# Patient Record
Sex: Male | Born: 1955 | Race: White | Hispanic: No | Marital: Married | Smoking: Never smoker
Health system: Southern US, Community
[De-identification: ages and names within clinical notes are randomized; demographics above are authoritative.]

## PROBLEM LIST (undated history)

## (undated) DIAGNOSIS — I499 Cardiac arrhythmia, unspecified: Secondary | ICD-10-CM

## (undated) DIAGNOSIS — I471 Supraventricular tachycardia, unspecified: Secondary | ICD-10-CM

## (undated) HISTORY — DX: Cardiac arrhythmia, unspecified: I49.9

## (undated) HISTORY — PX: KNEE SURGERY: SHX244

---

## 2005-10-11 ENCOUNTER — Emergency Department: Payer: Self-pay | Admitting: Emergency Medicine

## 2007-08-25 ENCOUNTER — Observation Stay (HOSPITAL_COMMUNITY): Admission: EM | Admit: 2007-08-25 | Discharge: 2007-08-26 | Payer: Self-pay | Admitting: Cardiology

## 2007-08-25 ENCOUNTER — Ambulatory Visit: Payer: Self-pay | Admitting: Internal Medicine

## 2007-08-25 ENCOUNTER — Encounter: Payer: Self-pay | Admitting: Cardiology

## 2007-12-02 ENCOUNTER — Ambulatory Visit: Payer: Self-pay | Admitting: Internal Medicine

## 2008-08-08 DIAGNOSIS — I471 Supraventricular tachycardia, unspecified: Secondary | ICD-10-CM | POA: Insufficient documentation

## 2010-03-09 ENCOUNTER — Emergency Department (HOSPITAL_COMMUNITY)
Admission: EM | Admit: 2010-03-09 | Discharge: 2010-03-09 | Payer: Self-pay | Source: Home / Self Care | Admitting: Emergency Medicine

## 2010-03-12 ENCOUNTER — Encounter: Payer: Self-pay | Admitting: Emergency Medicine

## 2010-03-12 LAB — CBC
MCH: 31.5 pg (ref 26.0–34.0)
MCV: 86.1 fL (ref 78.0–100.0)
Platelets: 211 10*3/uL (ref 150–400)
RBC: 5.04 MIL/uL (ref 4.22–5.81)
RDW: 12.5 % (ref 11.5–15.5)
WBC: 5.7 10*3/uL (ref 4.0–10.5)

## 2010-03-12 LAB — DIFFERENTIAL
Basophils Relative: 0 % (ref 0–1)
Eosinophils Absolute: 0.2 10*3/uL (ref 0.0–0.7)
Eosinophils Relative: 3 % (ref 0–5)
Lymphocytes Relative: 46 % (ref 12–46)
Lymphs Abs: 2.6 10*3/uL (ref 0.7–4.0)
Neutro Abs: 2.5 10*3/uL (ref 1.7–7.7)

## 2010-03-12 LAB — BASIC METABOLIC PANEL
CO2: 26 mEq/L (ref 19–32)
Calcium: 9.6 mg/dL (ref 8.4–10.5)
GFR calc Af Amer: >60 mL/min (ref >60)
Sodium: 137 mEq/L (ref 135–145)

## 2010-03-12 LAB — POCT CARDIAC MARKERS
CKMB, poc: 2.4 ng/mL (ref 1.0–8.0)
Troponin i, poc: <0.05 ng/mL (ref 0.00–0.09)

## 2010-03-12 LAB — D-DIMER, QUANTITATIVE: D-Dimer, Quant: 0.26 ug/mL-FEU (ref 0.00–0.48)

## 2010-07-03 NOTE — Cardiovascular Report (Signed)
NAME:  [name] [name], [name] [name]                  ACCOUNT NO.:  [id number]   MEDICAL RECORD NO.:  [id number]          PATIENT TYPE:  INP   LOCATION:  4733                         FACILITY:  [facility]   PHYSICIAN:  Rollene Rotunda, MD, FACCDATE OF BIRTH:  [birth year]   DATE OF PROCEDURE:  DATE OF DISCHARGE:                            CARDIAC CATHETERIZATION   PRIMARY CARE PHYSICIAN:  None.   PROCEDURE:  Left heart catheterization/arteriography.   INDICATIONS:  The patient has non-Q-wave myocardial infarction in the  phase of supraventricular tachycardia.   PROCEDURE NOTE:  Left heart catheterization performed via the right  femoral artery.  The artery was cannulated using anterior Blankley puncture.  A #6-French arterial sheath was inserted via the modified Seldinger  technique.  Preformed Judkins and a pigtail catheter were utilized.  The  patient tolerated the procedure well, and he left the lab in stable  condition.   RESULTS:  Hemodynamics:  LV 133/11, AO 130/82.   Coronaries:  Left main was normal.  The LAD was normal.  The first  diagonal and second diagonal were moderate size and normal.  The third  diagonal was small and normal.  The circumflex had ostial 25% stenosis  in the AV groove.  The AV groove had diffuse luminal irregularities.  There was a mid obtuse marginal which was very large and normal.   Right coronary artery:  Right coronary artery is dominant vessel.  It  was normal.  PDA was moderate size and normal.   Left ventriculogram:  A left ventriculogram was obtained in the RAO  projection.  The EF was 65% with normal Knickerbocker motion.   CONCLUSION:  Mild coronary artery plaque.  Normal left ventricular  function.   PLAN:  The patient will have further evaluation by electrophysiology for  management of his supraventricular cardia.      Rollene Rotunda, MD, [facility]  Electronically Signed     JH/MEDQ  D:  08/25/2007  T:  08/26/2007  Job:  [id number]

## 2010-07-03 NOTE — Assessment & Plan Note (Signed)
[facility]                         ELECTROPHYSIOLOGY OFFICE NOTE   NAME:Wallander, DOC MANDALA                         MRN:          [id number]  DATE:12/02/2007                            DOB:          03/19/1955    Mr. Plass returns today for followup.  He is a very pleasant middle-aged  man with a history of SVT several weeks ago resulting in admission to  the hospital where he ruled in for MI, underwent catheterization  demonstrating no obstructive coronary artery disease.  This was narrow  QRS tachycardia.  It was treated with adenosine with termination.  When  I saw him in the hospital several months ago, I offered him catheter  ablation simply because he had ruled in for MI with his episode of SVT  along with a severe chest pain.  The patient at that time want to  consider his options and at that time we recommended that he stop using  tobacco and also stop using caffeine.  He subsequently done this and  returns today for followup.  He denies chest pain or shortness of  breath.  He has had no recurrent symptomatic SVT.  He is on no  medications.   PHYSICAL EXAMINATION:  GENERAL:  On exam, he is a pleasant, well-  appearing, middle-aged man in no acute distress.  VITAL SIGNS:  Blood pressure was 122/72, the pulse 70 and regular,  respirations were 18, and weight was 255 pounds.  NECK:  No jugular distention.  LUNGS:  Clear bilaterally to auscultation.  No wheezes, rales, or  rhonchi are present.  CARDIAC:  Regular rate and rhythm.  Normal S1and S2.  No murmurs, rubs,  or gallops present.  ABDOMINAL:  Soft and nontender.  EXTREMITIES:  Demonstrate no edema.   IMPRESSION:  Supraventricular tachycardia with a single isolated  episode.   DISCUSSION:  I have discussed treatment options with the patient and his  wife who is with him today.  While catheter ablation was considered an  option, my recommendation and plan would be undergo a period of  watchful  waiting.  He has been asymptomatic since his one and only episode of  SVT, and for this reason, I have recommended period of watchful waiting.  Obviously, if he is becomes quite anxious about recurrent SVT or  actually experiences recurrent SVT, then catheter ablation would  certainly be recommended.  I will see the patient back in 6 months.     Doylene Canning. Ladona Ridgel, MD  Electronically Signed    GWT/MedQ  DD: 12/02/2007  DT: 12/03/2007  Job #: [id number]

## 2010-07-03 NOTE — H&P (Signed)
NAME:  [name] [name], [name] [name]                  ACCOUNT NO.:  [id number]   MEDICAL RECORD NO.:  [id number]          PATIENT TYPE:  INP   LOCATION:  2919                         FACILITY:  [facility]   PHYSICIAN:  Wendi Snipes, MD DATE OF BIRTH:  [birth year]   DATE OF ADMISSION:  08/25/2007  DATE OF DISCHARGE:                              HISTORY & PHYSICAL   CHIEF COMPLAINT:  Palpitations, shortness of breath.   HISTORY OF PRESENT ILLNESS:  This is a 55 year old healthy white male  with no past medical history who presented to [facility] shortly after  midnight after noticing a rapid pulse that occurred at approximately  10:30 at night.  This had never happened before.  He noted that he was  in his usual state of health in the evening and laid down and felt fine;  however, he felt restless and then all of a sudden noticed that his  pulse began to race.  He had some shortness of breath associated with  these symptoms; however, he had no chest pain, syncope, presyncope,  increased lower extremity edema, paroxysmal nocturnal dyspnea or  orthopnea with these symptoms.  He presented to [facility] where he was  found to be in a regular SVT which was treated with adenosine and  subsequently converted to normal sinus rhythm.  His troponin was  elevated to 0.68 with some lateral ST depressions on his tachycardiac  EKG.  He was subsequently transferred to [facility] for further  evaluation and management of the SVT.   PAST MEDICAL HISTORY:  None.   MEDICATIONS:  None.   SOCIAL HISTORY:  He lives in [place].  He is employed.  He is married.  He does not smoke.   FAMILY HISTORY:  His dad died of an unknown illness at 35.  Otherwise  hypertension runs in his family.  No premature coronary disease that he  knows of.   REVIEW OF SYSTEMS:  All 14 systems were reviewed and were negative  except as mentioned in the HPI.   PHYSICAL EXAMINATION:  VITAL SIGNS:  His blood pressure is 127/77, his  pulse is  57.  He is sating 99% on room air.  He is breathing 16 times a  minute.  GENERAL:  He is a 55 year old white male appearing his stated age in no  acute distress.  HEENT:  Moist mucous membranes.  Pupils equal, round, reactive to light  and accommodation.  Anicteric sclerae.  NECK:  No jugular venous distention.  No thyromegaly.  CARDIAC:  Regular rate and rhythm.  No murmurs, rubs or gallops.  PULMONARY:  Clear to auscultation bilaterally.  ABDOMEN:  Nontender, nondistended.  Positive bowel sounds.  No masses.  EXTREMITIES:  No clubbing, cyanosis or edema.  Pulses 3+ throughout.  SKIN:  Warm, dry and intact.  No rashes.  NEUROLOGIC:  Cranial nerves II-XII grossly intact.  Alert and oriented  x3.  No focal neurologic deficits.  PSYCHIATRIC:  Mood and affect are appropriate.   LABORATORY AND RADIOLOGY REVIEW:  His troponin went from 0.17 to 0.68  and then subsequently to  0.81.  His CK-MB was mildly elevated at 11.8.  His white blood cell count is not elevated.  His hematocrit is 40.  His  creatinine is 1.04.   ELECTROCARDIOGRAM:  Tachycardic EKG shows a regular narrow complex  tachycardia at 171 beats per minute with possible retrograde inverted T-  waves in inferior leads which suggests AVNRT.   ASSESSMENT/PLAN:  1. 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  We will place      him on a long-acting calcium channel blocker at this point until      further plans are made for possible ablation of this rhythm.  He is      currently asymptomatic, hemodynamically stable.  His EKG shows no      evidence of ischemia.  His cardiac biomarkers are likely elevated      to a perfusion demand mismatch secondary to extreme tachycardia.      There are ST depressions laterally; however, this can be normal in      this type of arrhythmia.  He may benefit from a noninvasive risk      stratification as an outpatient after this acute event.  Will check      an echocardiogram for structural  heart disease in the morning;      however, by exam, his left ventricular function is intact.      Wendi Snipes, MD  Electronically Signed     BHH/MEDQ  D:  08/25/2007  T:  08/25/2007  Job:  [id number]

## 2010-07-03 NOTE — Discharge Summary (Signed)
NAME:  Gelber, Rajan                  ACCOUNT NO.:  [id number]   MEDICAL RECORD NO.:  [id number]          PATIENT TYPE:  INP   LOCATION:  4733                         FACILITY:  [facility]   PHYSICIAN:  Doylene Canning. Ladona Ridgel, MD    DATE OF BIRTH:  23-Oct-1955   DATE OF ADMISSION:  08/25/2007  DATE OF DISCHARGE:  08/26/2007                               DISCHARGE SUMMARY   ALLERGIES:  This patient has no known drug allergies.   TIME FOR DICTATION AND EXAM:  Greater than 40 minutes.   He has no primary caregiver.   FINAL DIAGNOSES:  1. Admitted to [facility] with supraventricular tachycardia, which      broke with adenosine probably a reentry pattern (this is his only      episode at age 55).  2. Electrocardiogram shows lateral leads with ST depression.      a.     Elevated troponin I studies with a zenith of 2.07, this was       NQWMI (type II).  3. Left heart catheterization, August 25, 2007, nonobstructive coronary      artery disease, ejection fraction 69%.  4. Resting bradycardia unable to modulate the atrioventricular node      with medication going forward.  5. A 2-D echocardiogram ejection fraction of 55%-65%, no left      ventricular Troiani motion abnormalities, and mild basal septal      hypertrophy.   SECONDARY DIAGNOSIS:  Hypertension.   PLAN:  To see Dr. Ladona Ridgel, Wednesday, December 02, 2007, at 3:30 p.m.   PROCEDURE:  1. A 2-D echocardiogram on August 25, 2007, ejection fraction 55%-65%, no      left ventricular Lutter motion abnormalities.  2. Left heart catheterization on August 25, 2007, study showed that the      left main was free of disease.  The LAD also free of disease.  The      left circumflex had an ostial 25% stenosis in the AV groove,      diffuse luminal irregularities.  Right coronary artery is dominant      without stenosis.  The PDA, a moderate size and normal ejection      fraction at catheterization of 69%.   BRIEF HISTORY:  Mr. Celestine is 55 year old male.  He has no past  medical  history.  He presented to [facility] shortly after midnight after  noticing a rapid pulse, which had been preceding for about 40 minutes  prior to his admission.   The patient was in his usual state of health.  That evening, he had been  slightly deprived of sleep having had only 5-1/2 hours the evening  before.  He also started noticed that his pulse began to race.  He had  some shortness of breath.  He had no chest pain, no syncope, no  presyncope, no dyspnea, and no orthopnea.   He presented to [facility].  He was found to be in SVT.  This was  treated with adenosine and converted to sinus rhythm.  The patient's  cardiac enzymes were elevated and  he had some lateral ST depressions.  He was referred to [facility] for further evaluation.   HOSPITAL COURSE:  The patient presents from the emergency room at [facility].  He had an episode of his first one of SVT probably a reentry  pattern.  This was aborted with adenosine therapy.  Irregularities on  the electrocardiogram were noted.  He was transferred to [facility] where he underwent left heart catheterization on August 25, 2007.  The study as dictated above showed nonobstructive coronary artery  disease with preserved ejection fraction.  An electrophysiology consult  was obtained due to the patient having SVT with 170 beats per minute  speed.  The risks and benefits of ablation therapy have been discussed  with the patient.  He wishes to take a breather to discharge home and to  consider ablation in the future.  He was advised to avoid caffeine in  coffee or in soda.  He is advised to drink Gatorade if he is active and  perspires heavily.  He had a resting bradycardia making the use of AV  node modulators unrealistic.  The patient discharged on enteric-coated  aspirin 81 mg daily.  He will follow up with Dr. Ladona Ridgel in the  [facility], Wednesday, December 02, 2007, at  3:30.   LAB  STUDIES THIS ADMISSION:  On the day of discharge, hemoglobin 14.6,  hematocrit 40.9, white cells 8.2, and platelets 215.  Serum electrolytes  on day of discharge, sodium 140, potassium 3.9, chloride 107,  bicarbonate 27, BUN is 8, creatinine is 0.87, and glucose is 91.  Protime this admission 13.9, INR 1.1, alkaline phosphatase 33, SGOT 34,  SGPT is 30, and troponin I studies were in serial fashion 0.81 and is  2.07.  TSH this admission is 2.214.  The BNP this admission was 52.      Maple Mirza, PA      Doylene Canning. Ladona Ridgel, MD  Electronically Signed    GM/MEDQ  D:  08/26/2007  T:  08/27/2007  Job:  [id number]

## 2010-11-15 LAB — COMPREHENSIVE METABOLIC PANEL
AST: 34
Albumin: 3.6
Chloride: 106
Creatinine, Ser: 0.9
GFR calc Af Amer: >60
Potassium: 3.7
Total Bilirubin: 0.8
Total Protein: 6

## 2010-11-15 LAB — DIFFERENTIAL
Lymphocytes Relative: 24
Lymphs Abs: 1.5
Monocytes Absolute: 0.3
Monocytes Relative: 5
Neutro Abs: 4.2

## 2010-11-15 LAB — CBC
HCT: 40.9
Hemoglobin: 14.3
MCV: 90.4
Platelets: 215
Platelets: 229
RBC: 4.54
RDW: 12.6
RDW: 12.8
WBC: 6.2
WBC: 7.1
WBC: 8.2

## 2010-11-15 LAB — POCT CARDIAC MARKERS
CKMB, poc: 7.3
Myoglobin, poc: 254
Troponin i, poc: 0.68

## 2010-11-15 LAB — HEPARIN ANTI-XA: Heparin LMW: <0.1

## 2010-11-15 LAB — CARDIAC PANEL(CRET KIN+CKTOT+MB+TROPI)
Relative Index: 3.4 — ABNORMAL HIGH
Total CK: 480 — ABNORMAL HIGH
Troponin I: 2.07

## 2010-11-15 LAB — BASIC METABOLIC PANEL
BUN: 8
CO2: 26
Calcium: 9
Calcium: 9.5
Creatinine, Ser: 0.87
GFR calc Af Amer: >60
GFR calc non Af Amer: >60
GFR calc non Af Amer: >60
Glucose, Bld: 91
Potassium: 3.9
Sodium: 140

## 2010-11-15 LAB — PROTIME-INR
INR: 1.1
Prothrombin Time: 13.9

## 2010-11-15 LAB — TSH: TSH: 2.214 (ref 0.350–4.500)

## 2010-11-15 LAB — CK TOTAL AND CKMB (NOT AT ARMC)
CK, MB: 11.8 — ABNORMAL HIGH
Total CK: 596 — ABNORMAL HIGH

## 2010-11-15 LAB — B-NATRIURETIC PEPTIDE (CONVERTED LAB): Pro B Natriuretic peptide (BNP): 52

## 2011-03-28 ENCOUNTER — Encounter: Payer: Self-pay | Admitting: Internal Medicine

## 2011-03-28 ENCOUNTER — Ambulatory Visit (INDEPENDENT_AMBULATORY_CARE_PROVIDER_SITE_OTHER): Payer: PRIVATE HEALTH INSURANCE | Admitting: Internal Medicine

## 2011-03-28 DIAGNOSIS — I471 Supraventricular tachycardia: Secondary | ICD-10-CM

## 2011-03-28 MED ORDER — METOPROLOL SUCCINATE ER 50 MG PO TB24: 50.0000 mg | ORAL_TABLET | Freq: Every day | ORAL | Status: DC

## 2011-03-28 NOTE — Patient Instructions (Signed)
Your physician has recommended you make the following change in your medication: start taking Metoprolol 50 mg as needed.  Your physician recommends that you schedule a follow-up appointment in: as needed.

## 2011-03-31 NOTE — Assessment & Plan Note (Signed)
I have discussed the treatment options with the patient. I have recommended he use beta blockers to take on an as needed basis. If his symptoms increase in frequency then catheter ablation would be a consideration.

## 2011-03-31 NOTE — Progress Notes (Signed)
HPI Mr. Noseworthy returns today after a several year absence from our EP clinic. He has a h/o SVT which has been well controlled. He experienced an episode several weeks ago where his heart raced for nearly an hour before valsalva maneuvers resulted in its termination. It was associated with SOB. No syncope or chest pain.  No Known Allergies   Current Outpatient Prescriptions  Medication Sig Dispense Refill  . aspirin 325 MG tablet Take 325 mg by mouth daily.      . metoprolol succinate (TOPROL XL) 50 MG 24 hr tablet Take 1 tablet (50 mg total) by mouth daily. Take with or immediately following a meal.  30 tablet  1     Past Medical History  Diagnosis Date  . Arrhythmia     SVT    ROS:   All systems reviewed and negative except as noted in the HPI.   Past Surgical History  Procedure Date  . Knee surgery      No family history on file.   History   Social History  . Marital Status: Married    Spouse Name: N/A    Number of Children: N/A  . Years of Education: N/A   Occupational History  . Not on file.   Social History Main Topics  . Smoking status: Never Smoker   . Smokeless tobacco: Not on file  . Alcohol Use: Yes  . Drug Use: No  . Sexually Active: Not on file   Other Topics Concern  . Not on file   Social History Narrative  . No narrative on file     BP 146/91  Pulse 58  Resp 18  Ht 6\' 3"  (1.905 m)  Wt 119.75 kg (264 lb)  BMI 33.00 kg/m2  Physical Exam:  Well appearing middle aged man, NAD HEENT: Unremarkable Neck:  No JVD, no thyromegally Lungs:  Clear with no wheezes. HEART:  Regular rate rhythm, no murmurs, no rubs, no clicks Abd:  soft, positive bowel sounds, no organomegally, no rebound, no guarding Ext:  2 plus pulses, no edema, no cyanosis, no clubbing Skin:  No rashes no nodules Neuro:  CN II through XII intact, motor grossly intact  EKG Sinus bradycardia  Assess/Plan:

## 2012-04-01 ENCOUNTER — Inpatient Hospital Stay: Payer: Self-pay | Admitting: Specialist

## 2012-04-01 LAB — CBC
HGB: 15.6 g/dL (ref 13.0–18.0)
MCV: 90 fL (ref 80–100)
WBC: 10.1 10*3/uL (ref 3.8–10.6)

## 2012-04-01 LAB — BASIC METABOLIC PANEL
Anion Gap: 6 — ABNORMAL LOW (ref 7–16)
Calcium, Total: 9.1 mg/dL (ref 8.5–10.1)
Chloride: 104 mmol/L (ref 98–107)
Co2: 27 mmol/L (ref 21–32)
Osmolality: 277 (ref 275–301)

## 2012-04-01 LAB — TROPONIN I: Troponin-I: 7.73 ng/mL — ABNORMAL HIGH

## 2012-04-01 LAB — PROTIME-INR: INR: 0.9

## 2012-04-02 LAB — TROPONIN I: Troponin-I: 5.66 ng/mL — ABNORMAL HIGH

## 2012-04-02 LAB — CK TOTAL AND CKMB (NOT AT ARMC): CK, Total: 288 U/L — ABNORMAL HIGH (ref 35–232)

## 2012-04-03 DIAGNOSIS — I214 Non-ST elevation (NSTEMI) myocardial infarction: Secondary | ICD-10-CM

## 2012-04-03 LAB — BASIC METABOLIC PANEL
Anion Gap: 9 (ref 7–16)
BUN: 13 mg/dL (ref 7–18)
Chloride: 109 mmol/L — ABNORMAL HIGH (ref 98–107)
EGFR (African American): >60
EGFR (Non-African Amer.): >60
Glucose: 95 mg/dL (ref 65–99)

## 2012-04-14 ENCOUNTER — Encounter: Payer: Self-pay | Admitting: Cardiovascular Disease

## 2013-01-28 ENCOUNTER — Ambulatory Visit: Payer: Self-pay | Admitting: Gastroenterology

## 2014-06-10 NOTE — Consult Note (Signed)
General Aspect 59 year old male with a hx of SVT, who presents to the hospital with palpitations, diaphoresis and shortness of breath.Cardiology was consulted for arrhythmia, NSTEMI (troponin elevation to 7).   history of intermittent supraventricular tachycardia, takes metoprolol on an as needed basis. Has seen Dr. Cristopher Peru from [facility] cardiology who had recommended a possible ablation if he continues to have  SVT epsiodes. No follow up in over a year and a half. The patient says he has not really had any symptoms of palpitations and has only used his metoprolol maybe once in the past 3 years.   Yesterday at work, he started developing significant palpitations and then became diaphoretic with shortness of breath. He tried Valsalva  to see if it would break SVT, and after about an hour, the symptoms eventually broke. He did not have his metoprolol with him.   He came to the ER for further evaluation. Initial EKG at triage showed some ST changes in the lateral leads, consistent with ST depressions. The patient never really had any chest pain.   Present Illness .  PAST MEDICAL HISTORY: Is consistent with SVT.    ALLERGIES: No known drug allergies.   SOCIAL HISTORY: No smoking. No alcohol abuse. No illicit drug abuse. Lives at home by himself.   FAMILY HISTORY:  father died when he was 65 years of age. His mother is alive, she has ovarian cancer.   CURRENT MEDICATIONS: Metoprolol 50 mg as needed, aspirin 81 mg daily.   Physical Exam:  GEN well developed, well nourished, no acute distress   HEENT red conjunctivae   NECK supple  No masses   RESP clear BS   CARD Regular rate and rhythm  No murmur   ABD denies tenderness  soft   LYMPH negative axillae   EXTR negative edema   SKIN normal to palpation   NEURO motor/sensory function intact   PSYCH alert, A+O to time, place, person, good insight   Review of Systems:  Subjective/Chief Complaint tachycardia, palpitations,  resolved   General: No Complaints   Skin: No Complaints   ENT: No Complaints   Eyes: No Complaints   Neck: No Complaints   Respiratory: No Complaints   Cardiovascular: Palpitations   Gastrointestinal: No Complaints   Genitourinary: No Complaints   Vascular: No Complaints   Musculoskeletal: No Complaints   Neurologic: No Complaints   Hematologic: No Complaints   Endocrine: No Complaints   Psychiatric: No Complaints   Review of Systems: All other systems were reviewed and found to be negative   Medications/Allergies Reviewed Medications/Allergies reviewed     SVT - Supraventricular Tachycardia:   Lab Results:  Routine Chem:  12-Feb-14 16:37   Creatinine (comp)  1.68    21:04   Result Comment troponin - RESULTS VERIFIED BY REPEAT TESTING.  - mpg c/ margaret james @ 2152 04/01/12  - READ-BACK PROCESS PERFORMED.  Result(s) reported on 01 Apr 2012 at 09:51PM.  13-Feb-14 04:10   Result Comment troponin - RESULTS VERIFIED BY REPEAT TESTING.  - prev called 2/12/14at 2152by mpg.nbb  Result(s) reported on 02 Apr 2012 at 05:56AM.  14-Feb-14 04:06   Glucose, Serum 95  BUN 13  Creatinine (comp) 0.94  Sodium, Serum 141  Potassium, Serum 3.8  Chloride, Serum  109  CO2, Serum 23  Calcium (Total), Serum 8.7  Anion Gap 9  Osmolality (calc) 281  eGFR (African American) >60  eGFR (Non-African American) >60 (eGFR values <31m/min/1.73 m2 may be an indication of chronic  kidney disease (CKD). Calculated eGFR is useful in patients with stable renal function. The eGFR calculation will not be reliable in acutely ill patients when serum creatinine is changing rapidly. It is not useful in  patients on dialysis. The eGFR calculation may not be applicable to patients at the low and high extremes of body sizes, pregnant women, and vegetarians.)  Cardiac:  12-Feb-14 16:37   Troponin I < 0.02 (0.00-0.05 0.05 ng/mL or less: NEGATIVE  Repeat testing in 3-6 hrs  if clinically  indicated. >0.05 ng/mL: POTENTIAL  MYOCARDIAL INJURY. Repeat  testing in 3-6 hrs if  clinically indicated. NOTE: An increase or decrease  of 30% or more on serial  testing suggests a  clinically important change)    21:04   Troponin I  7.73 (0.00-0.05 0.05 ng/mL or less: NEGATIVE  Repeat testing in 3-6 hrs  if clinically indicated. >0.05 ng/mL: POTENTIAL  MYOCARDIAL INJURY. Repeat  testing in 3-6 hrs if  clinically indicated. NOTE: An increase or decrease  of 30% or more on serial  testing suggests a  clinically important change)  CK, Total  293  CPK-MB, Serum  26.6 (Result(s) reported on 01 Apr 2012 at 09:45PM.)  13-Feb-14 04:10   Troponin I  5.66 (0.00-0.05 0.05 ng/mL or less: NEGATIVE  Repeat testing in 3-6 hrs  if clinically indicated. >0.05 ng/mL: POTENTIAL  MYOCARDIAL INJURY. Repeat  testing in 3-6 hrs if  clinically indicated. NOTE: An increase or decrease  of 30% or more on serial  testing suggests a  clinically important change)  CK, Total  288  CPK-MB, Serum  25.9 (Result(s) reported on 02 Apr 2012 at 05:49AM.)   Radiology Results: [place]:    13-Feb-14 10:18, US Kidney Bilateral  US Kidney Bilateral   REASON FOR EXAM:    Acute Renal Failure.  COMMENTS:       PROCEDURE: [place]  - US KIDNEY  - Apr 02 2012 10:18AM     RESULT: Prevoid images show bladder volume of 235.9 cc. Ureteral jets are   demonstrated bilaterally. The prostate measures 3.97 x 4.07 x 5.10 cm for   a volume of 43.1 cc. The lateral aspect of the right kidney shows a   hypoechoic area measuring 1.34 x 1.38 x 1.28 cm. The right kidney itself   measures 13.71 x 5.43 x 4.99 cm. The left kidney measures 12.27 x 5.47 x   5.75 cm. Neither kidney shows a solid mass or evidence of stones. No   obstructive changes are demonstrated. No post void bladder images are   submitted.    IMPRESSION:   1. Right renal cyst.    Dictation Site: 2        Verified By: Sundra Aland, M.D., MD    No Known  Allergies:    Impression 59 yo male w/ hx of SVT came into hospital w/ palpitaitons, shortness of breath, diaphoresis.  Was in SVT but this resolved prior to him coming to the hospital.Episode lasted several hours.   Also noted to have an abnormal ECG on arrival with nonspecific ST ABN.  1. SVT -  Second epsiode in 5 years. This epsiode lasted hours.  He will follow up with Dr. Donita Brooks EP. He is interested in SVT ablation. - cont.  Metoprolol PRN and diltiazem 30 mg PRN for epsiodes (need script for diltiazem prn) Office closed today. I will arrange outpt follow up    2. Abnormal ECG/NSTEMI -  Troponin peak was 7, then 5 ST  depressions in lateral leads at triage.   cont. ASA, B-blocker, Lovenox, Statin.  cath this AM as renal function improved.   3. Acute Renal Failure - - Renal US benign Improved, suspect dehydration.   Electronic Signatures: Ida Rogue (MD)  (Signed 14-Feb-14 08:51)  Authored: General Aspect/Present Illness, History and Physical Exam, Review of System, Past Medical History, Home Medications, Labs, Radiology, Allergies, Impression/Plan   Last Updated: 14-Feb-14 08:51 by Ida Rogue (MD)

## 2014-06-10 NOTE — Discharge Summary (Signed)
PATIENT NAME:  [name] [name] MR#:  [id number] DATE OF BIRTH:  01-24-1956  DATE OF ADMISSION:  04/01/2012 DATE OF DISCHARGE:  04/03/2012  For a detailed note, please see the history and physical done on admission.   DIAGNOSES AT DISCHARGE IS AS FOLLOWS: 1. Supraventricular tachycardia, now resolved.  2. Elevated troponin, likely in the setting of supraventricular tachycardia, a non-ST-elevation myocardial infarction ruled out. 3. Acute renal failure, now resolved.   DIET: The patient is being discharged on a regular diet.   ACTIVITY: As tolerated.   FU INSTRUCTIONS: Followup with Dr. Julien Nordmannimothy Gollan  in the next 1 to 2 weeks, also follow up with Dr. Sharrell KuGreg Taylor from Electrophysiology at [facility] also in the next few weeks.   DISCHARGE MEDICATIONS: Aspirin 81 mg daily, Cardizem 30 mg t.i.d. as needed for palpitations and SVT and Lopressor 50 mg twice daily as needed for palpitations.   CONSULTANTS DURING THE HOSPITAL COURSE: Dr. Julien Nordmannimothy Gollan from Cardiology.   PERTINENT STUDIES DONE DURING THE HOSPITAL COURSE ARE AS FOLLOWS: An ultrasound of the kidneys showing a right renal cyst. A cardiac catheterization done on February 14th showing no evidence of any significant coronary artery disease.   BRIEF HOSPITAL COURSE: This is a 59 year old male who presented to the hospital secondary to shortness of breath, palpitations and noted to be in SVT with an abnormal EKG.   PROBLEM LIST: 1. SVT: This was likely the cause of the patient's diaphoresis, shortness of breath and palpitations. The patient apparently had broken into a normal sinus rhythm prior to coming into the hospital. He was observed overnight on telemetry, did not have any further episodes of supraventricular tachycardia. He follows up with Dr. Sharrell KuGreg Taylor. He has been possibly referred for ablation. He will continue followup with Dr. Ladona Ridgelaylor and continue his p.r.n. Lopressor and Cardizem for SVT as stated. He tried vagal maneuvers and other  care prior to coming into the hospital, which seemed to have broken his SVT into a sinus rhythm.  2. Abnormal EKG with elevated troponin. The patient has some significant ST depressions in the lateral leads an the initial EKG in the ER, and there was some concern that this was related to cardiac ischemia. His second troponins did turn out to be positive at 7. He was started on Lovenox, although he remained chest pain-free and hemodynamically stable. A Cardiology consult was obtained. The patient was seen by Dr. Mariah MillingGollan, who performed a cardiac catheterization on 04/03/2012 which showed no evidence of any significant coronary artery disease. Therefore it was thought that the troponin elevation and abnormal EKG was related to the prolonged SVT and demand ischemia, and a non-ST-elevation MI was ruled out.  3. Acute renal failure. The patient presented to the hospital with a creatinine of 1.68. He was hydrated with IV fluids. His creatinine since then has improved and now come back to baseline.   CODE STATUS: THE PATIENT IS A FULL CODE.  TIME SPENT ON DISCHARGE: 40 minutes.    ____________________________ Rolly PancakeVivek J. Cherlynn KaiserSainani, MD vjs:jm D: 04/03/2012 16:16:54 ET T: 04/04/2012 12:14:33 ET JOB#: [id number]  cc: Rolly PancakeVivek J. Cherlynn KaiserSainani, MD, <Dictator> Antonieta Ibaimothy J. Gollan, MD Doylene CanningGregg W. Ladona Ridgelaylor, MD [place] SirenVIVEK J Naveed Humphres MD ELECTRONICALLY SIGNED 04/05/2012 20:54

## 2014-06-10 NOTE — H&P (Signed)
PATIENT NAME:  [name] [name], [name] [name] MR#:  [id number] DATE OF BIRTH:  05/06/1955  DATE OF ADMISSION:  04/01/2012  PRIMARY CARE PHYSICIAN: Does not have one.  CARDIOLOGIST:  Doylene Canning. Ladona Ridgel, MD  CHIEF COMPLAINT: Palpitations.   HISTORY OF PRESENT ILLNESS: This is a 59 year old male who presents to the hospital due to palpitations, diaphoresis and shortness of breath. The patient says that he has a history of intermittent supraventricular tachycardia, takes metoprolol on an as needed basis. He follows up with Dr. Lewayne Bunting from [facility] cardiology who had recommended a possible ablation if he continues to have persistent SVTs, although he has not followed up in over a year and a half. The patient says he has not really had any symptoms of palpitations and has only used his metoprolol maybe once in the past 3 years. Today at work, he started developing significant palpitations and then became a bit diaphoretic and had some shortness of breath. He tried doing some vagal maneuvers like Valsalva and also trying to dip head in the snow to see if it would break SVT, although it did not initially work, after about an hour of him trying, the symptoms did eventually break. He did not have his metoprolol with him. He came to the ER for further evaluation. Initial EKG at triage showed some ST changes in the lateral leads, consistent with ST depressions. The patient never really had any chest pain. Hospitalist services were contacted for further treatment and evaluation.   REVIEW OF SYSTEMS:   CONSTITUTIONAL: No documented fever. No weight gain, no weight loss.  EYES: No blurred or double vision.  ENT: No tinnitus. No postnasal drip. No redness of the oropharynx.  RESPIRATORY: No cough, no wheeze. No hemoptysis. Positive dyspnea.  CARDIOVASCULAR: No chest pain, no orthopnea. Positive palpitations. No syncope.  GASTROINTESTINAL: No nausea, no vomiting, diarrhea, no abdominal pain, no melena or hematochezia.   GENITOURINARY: No dysuria or hematuria.  ENDOCRINE: No polyuria or nocturia. No heat or cold intolerance.  HEMATOLOGIC: No anemia, no bruising, no bleeding.  INTEGUMENTARY: No rashes. No lesions.   MUSCULOSKELETAL: No arthritis. No swelling. No gout.  NEUROLOGIC: No numbness or tingling. No ataxia. No seizure-type activity.  PSYCHIATRIC: No anxiety. No insomnia. No ADD.   PAST MEDICAL HISTORY: Is consistent with SVT.    ALLERGIES: No known drug allergies.   SOCIAL HISTORY: No smoking. No alcohol abuse. No illicit drug abuse. Lives at home by himself.   FAMILY HISTORY: The patient's father died when he was 28 years of age. His mother is alive, she has ovarian cancer.   CURRENT MEDICATIONS: Metoprolol 50 mg as needed, aspirin 81 mg daily.   PHYSICAL EXAMINATION ON ADMISSION: Is as follows:   VITAL SIGNS:  Are noted to be: Temperature, he is afebrile, pulse 71, respirations 20, blood pressure 127/69, sats 100% on room air.  GENERAL: He is a pleasant appearing male in no apparent distress.  HEENT: Atraumatic, normocephalic. Extraocular muscles are intact. Pupils are equal and reactive to light. Sclerae is anicteric. No conjunctival injection. No pharyngeal erythema.  NECK: Supple. There is no jugular venous distention, no bruits, lymphadenopathy or thyromegaly.  HEART: Regular rate and rhythm. No murmurs, rubs or clicks.  LUNGS: Clear to auscultation bilaterally. No rales, no rhonchi, no wheezes.  ABDOMEN: Soft, flat, nontender, nondistended. Has good bowel sounds. No hepatosplenomegaly appreciated.  EXTREMITIES: No evidence of any cyanosis, clubbing or peripheral edema. He has +2 pedal and radial pulses bilaterally.  NEUROLOGIC: The patient is alert,  awake, and oriented x 3 with no focal, motor or sensory deficits appreciated bilaterally.  SKIN: Moist and warm with no rash appreciated.  LYMPHATIC: There is no cervical or axillary lymphadenopathy.   LABORATORY, DIAGNOSTIC AND RADIOLOGIC  DATA:  Showed a serum glucose of 117, BUN 19, creatinine 1.6, sodium 137, potassium 3.9, chloride 104, bicarbonate 27. Troponin less than 0.02. White cell count 10, hemoglobin 15.6, hematocrit 45.1, platelet count 278.   EKG initially showed normal sinus rhythm with left axis deviation and ST changes in the lateral leads with ST depressions. Repeat EKG shows normal sinus rhythm with no ST changes.   ASSESSMENT AND PLAN: This is a 59 year old male with a history of supraventricular tachycardia presents to the hospital with palpitations, shortness of breath and diaphoresis was likely in supraventricular tachycardia, but had broken into a normal sinus rhythm prior to coming to the hospital. He was also initially noted to have an abnormal EKG with ST changes in the lateral leads.  1.  Supraventricular tachycardia. This is likely the cause of patient's symptoms given his history of it. This is likely what led to his palpitations, diaphoresis and shortness of breath. He is clinically asymptomatic now, as he is in a normal sinus rhythm. I will observe him overnight on telemetry, follow serial cardiac markers. I will place him on low-dose beta blocker to keep him out of any further arrhythmias. The patient has seen Dr. Lewayne BuntingGregg Taylor in the past, who recommended ablation, which he likely might benefit from now and instead of being on an as needed, metoprolol, he may need to be on scheduled metoprolol for now. I will get a cardiology consult. I discussed the case with Dr. Johney FrameAllred.  2.  Abnormal EKG. The patient noted to have ST depressions in the lateral leads at triage. Questionable if this is related to cardiac ischemia, although the patient never really had any true chest pain or anginal symptoms. His repeat EKGs now shows no ST changes. I discussed the case with Dr. Johney FrameAllred over the phone and as per him, it is very common to have ST changes if the patient has been on prolonged supraventricular tachycardia. Therefore, I  will hold off on giving any other anticoagulation like heparin and Lovenox for now. We will observe him on telemetry, follow serial cardiac markers, get a Myoview in the morning.  3.  Acute renal failure. The exact etiology is unclear. We will go ahead and hydrate him with IV fluids. Follow BUN and creatinine and urine output, renal dose medications and avoid nephrotoxins. I will repeat his creatinine in the morning. Also get a renal ultrasound. If his creatinine still remains elevated,  he may need outpatient nephrology follow up.   CODE STATUS: The patient is a full code.   TIME SPENT: 50 minutes.    ____________________________ Rolly PancakeVivek J. Cherlynn KaiserSainani, MD vjs:cc D: 04/01/2012 19:53:35 ET T: 04/01/2012 20:40:13 ET JOB#: [id number]  cc: Rolly PancakeVivek J. Cherlynn KaiserSainani, MD, <Dictator> [place] SirenVIVEK J Aaliyah Cancro MD ELECTRONICALLY SIGNED 04/05/2012 20:54

## 2014-09-13 IMAGING — US US RENAL KIDNEY
1 series · 14 of 25 positions shown · non-contrast
Comparison: none

REASON FOR EXAM: Acute Renal Failure.
COMMENTS:

[Series 1: us renal kidney · 0.28mm/px · 14 of 30 slices shown]
[im 1/30]
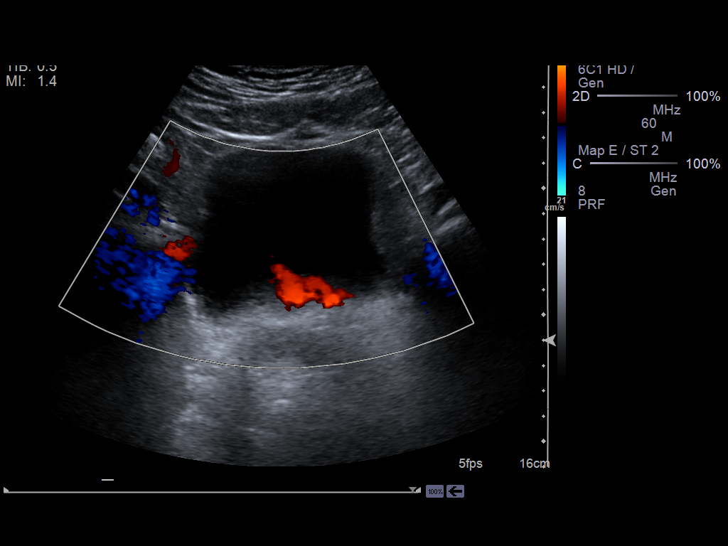
[im 3/30]
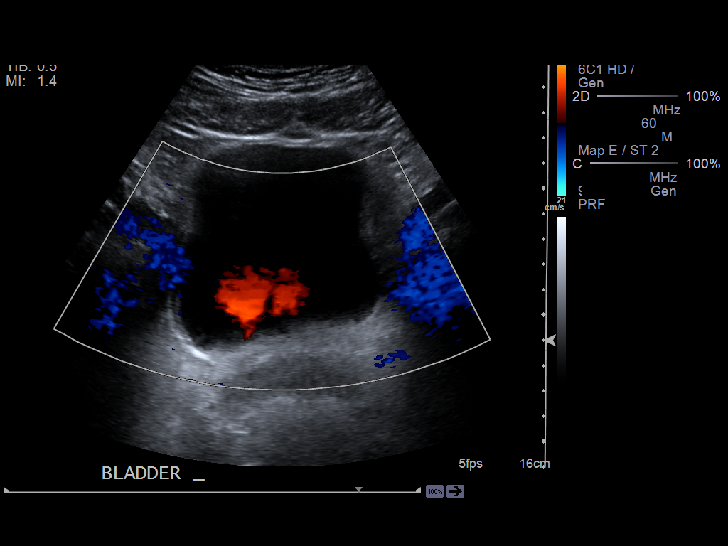
[im 5/30]
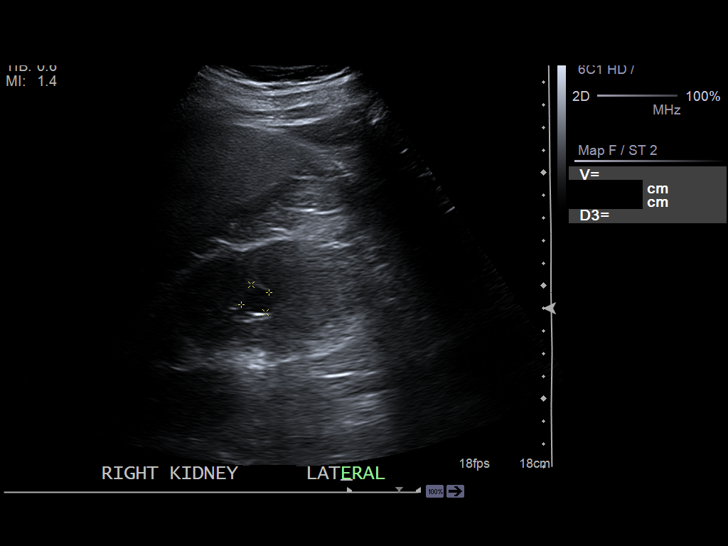
[im 8/30]
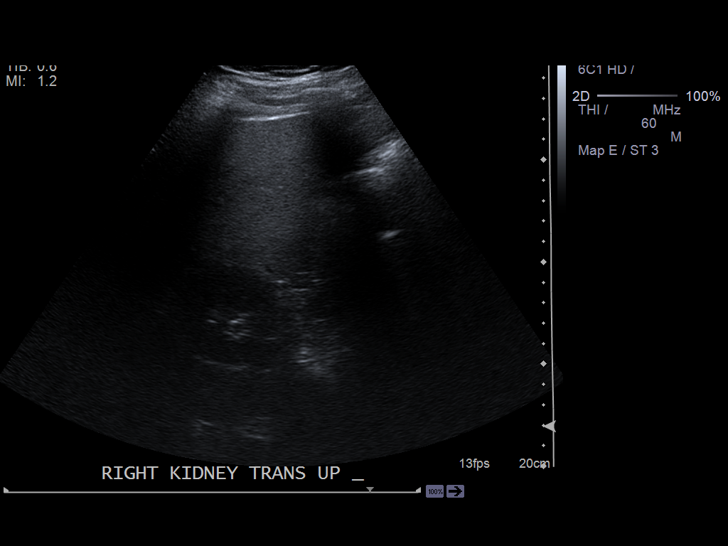
[im 10/30]
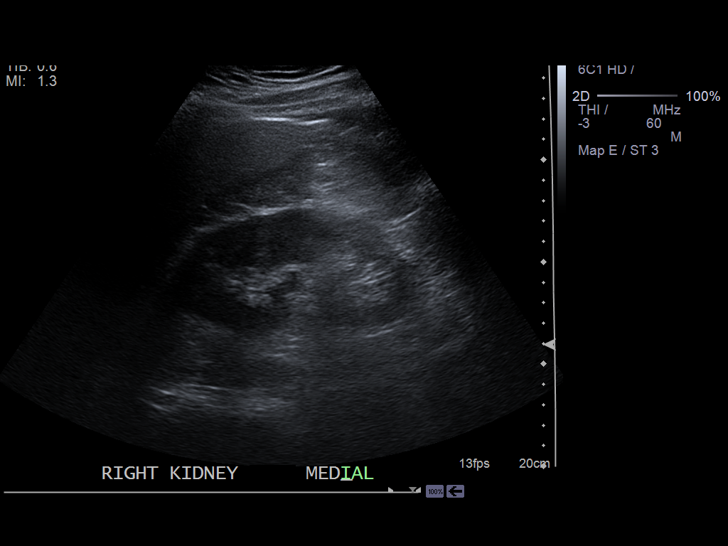
[im 11/30]
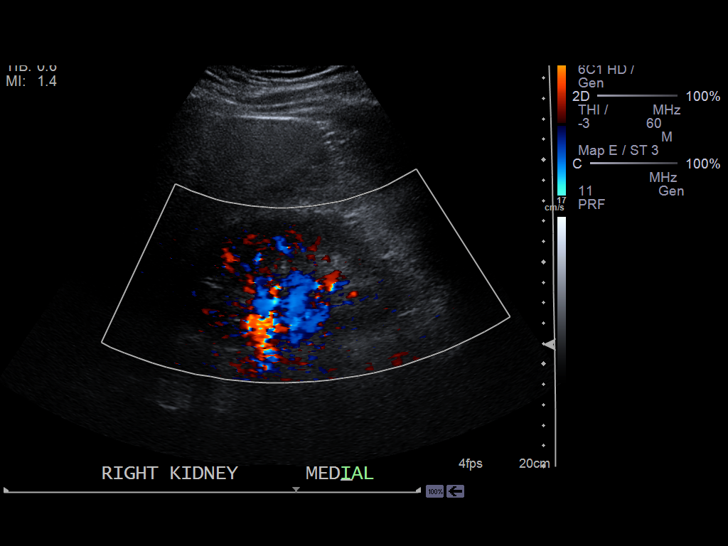
[im 14/30]
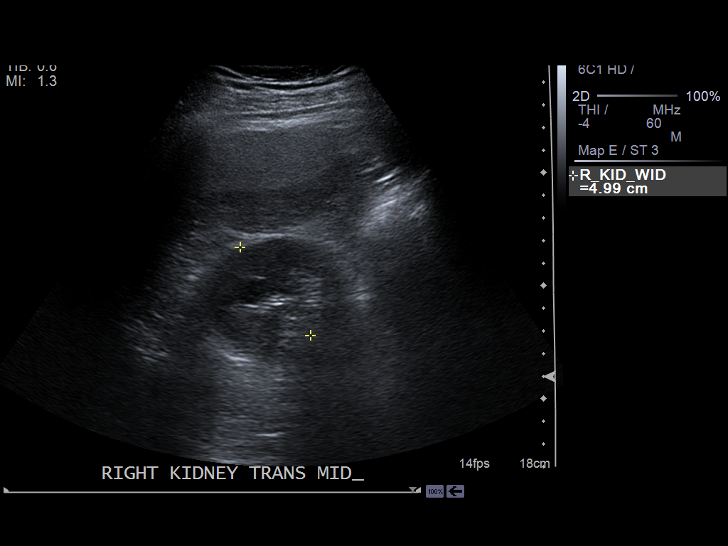
[im 16/30]
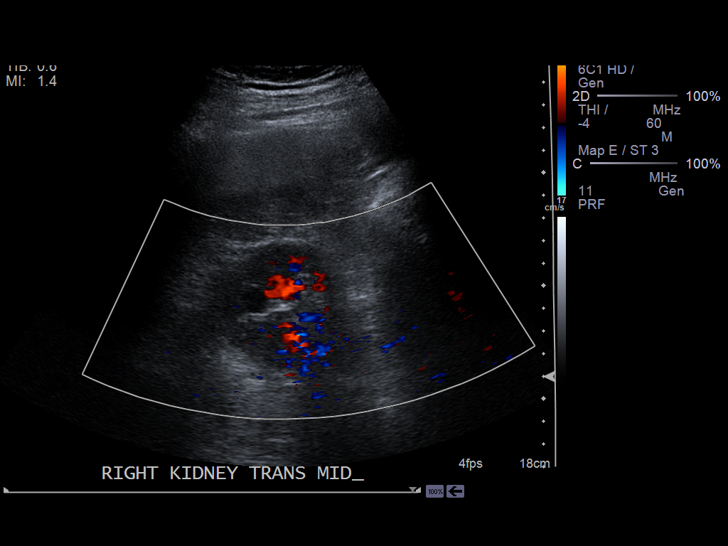
[im 19/30]
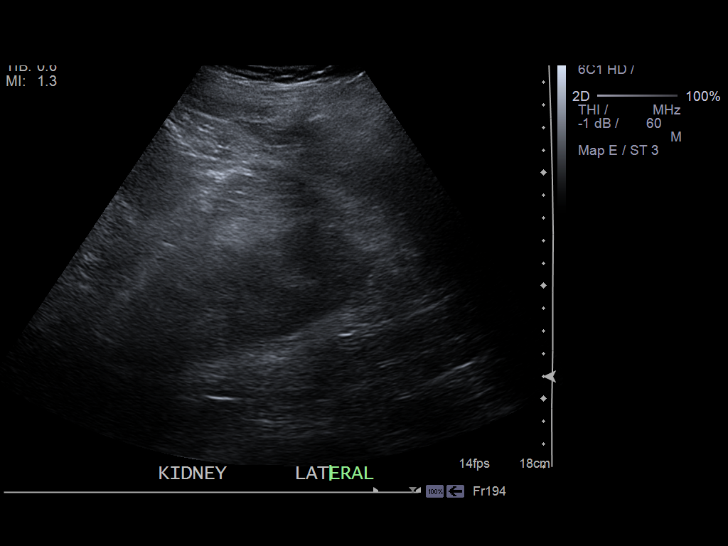
[im 20/30]
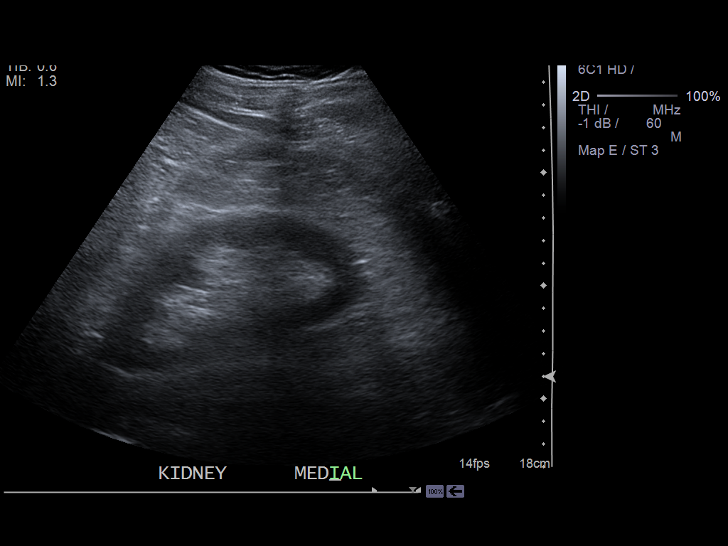
[im 22/30]
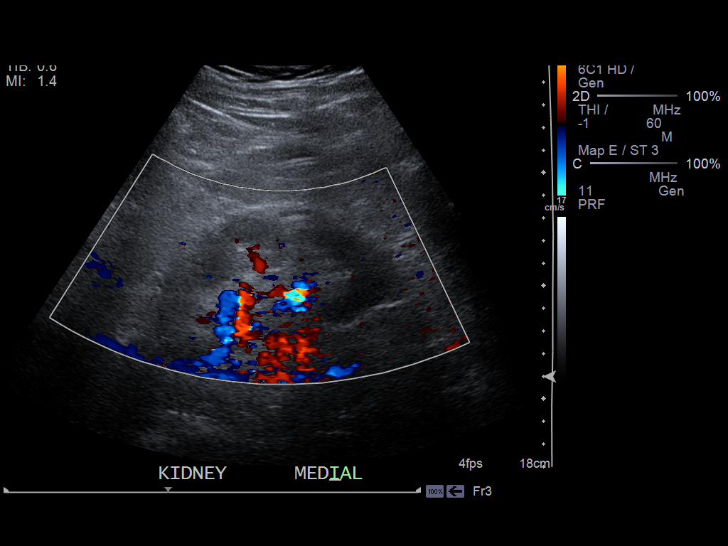
[im 25/30]
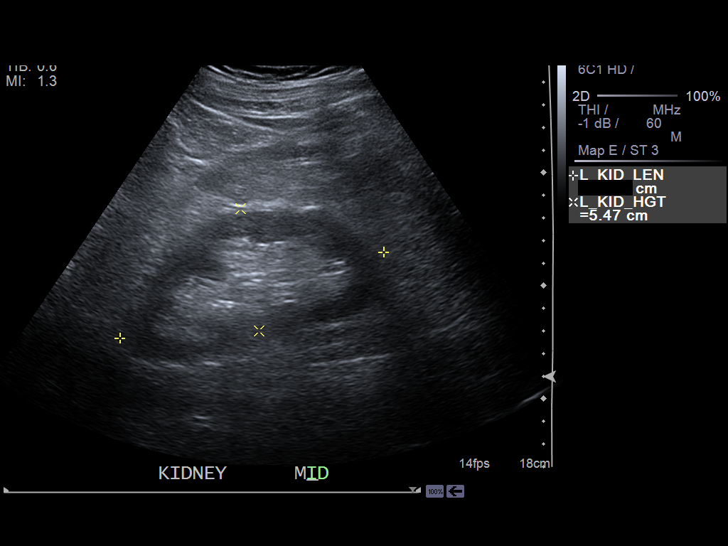
[im 27/30]
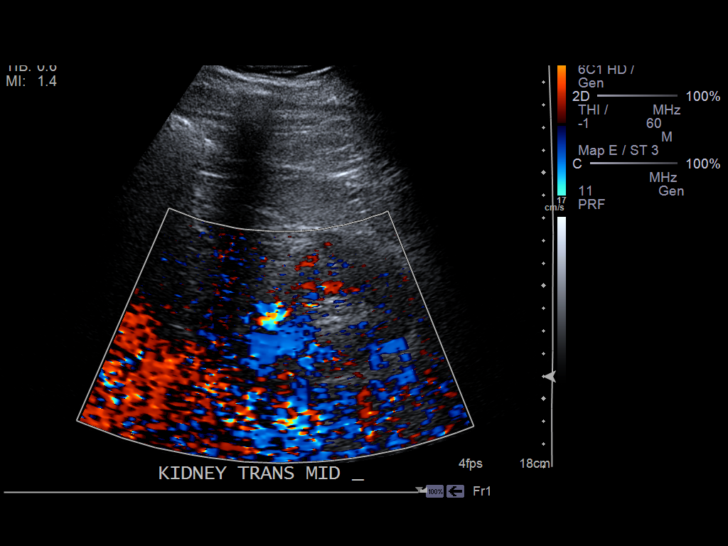
[im 30/30]
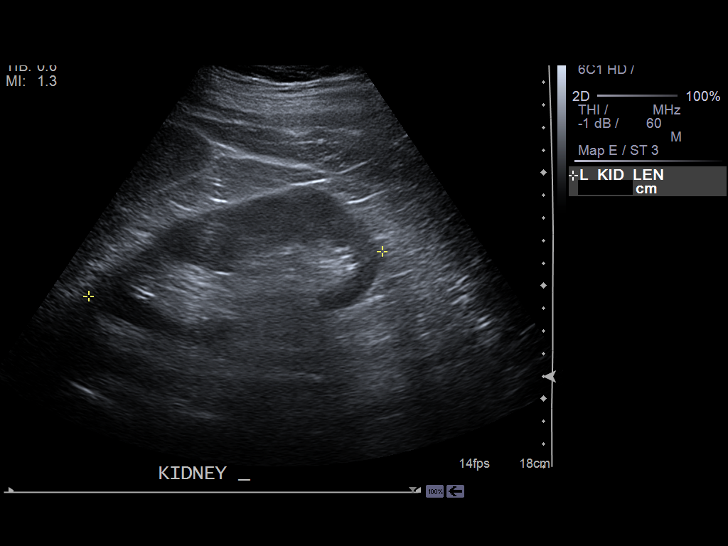

[14 of 25 positions shown; findings below may reference images not displayed]

PROCEDURE:     US  - US KIDNEY  - April 02, 2012 [DATE]

RESULT:     Prevoid images show bladder volume of 235.9 cc. Ureteral jets
are demonstrated bilaterally. The prostate measures 3.97 x 4.07 x 5.10 cm
for a volume of 43.1 cc. The lateral aspect of the right kidney shows a
hypoechoic area measuring 1.34 x 1.38 x 1.28 cm. The right kidney itself
measures 13.71 x 5.43 x 4.99 cm. The left kidney measures 12.27 x 5.47 x
5.75 cm. Neither kidney shows a solid mass or evidence of stones. No
obstructive changes are demonstrated. No post void bladder images are
submitted.
IMPRESSION: 1. Right renal cy[REDACTED]

## 2015-12-29 ENCOUNTER — Encounter: Payer: Self-pay | Admitting: Emergency Medicine

## 2015-12-29 ENCOUNTER — Emergency Department
Admission: EM | Admit: 2015-12-29 | Discharge: 2015-12-29 | Disposition: A | Discharge status: Home / Self Care | Payer: Managed Care, Other (non HMO) | Attending: Emergency Medicine | Admitting: Emergency Medicine

## 2015-12-29 DIAGNOSIS — R55 Syncope and collapse: Secondary | ICD-10-CM | POA: Diagnosis present

## 2015-12-29 DIAGNOSIS — Z79899 Other long term (current) drug therapy: Secondary | ICD-10-CM | POA: Insufficient documentation

## 2015-12-29 DIAGNOSIS — I471 Supraventricular tachycardia: Secondary | ICD-10-CM | POA: Diagnosis not present

## 2015-12-29 HISTORY — DX: Supraventricular tachycardia, unspecified: I47.10

## 2015-12-29 HISTORY — DX: Supraventricular tachycardia: I47.1

## 2015-12-29 LAB — CBC
HEMATOCRIT: 44.9 % (ref 40.0–52.0)
HEMOGLOBIN: 15.4 g/dL (ref 13.0–18.0)
MCH: 31.4 pg (ref 26.0–34.0)
MCHC: 34.3 g/dL (ref 32.0–36.0)
MCV: 91.4 fL (ref 80.0–100.0)
PLATELETS: 219 10*3/uL (ref 150–440)
RBC: 4.92 MIL/uL (ref 4.40–5.90)
RDW: 12.9 % (ref 11.5–14.5)
WBC: 6.8 10*3/uL (ref 3.8–10.6)

## 2015-12-29 LAB — BASIC METABOLIC PANEL
ANION GAP: 7 (ref 5–15)
BUN: 13 mg/dL (ref 6–20)
CHLORIDE: 102 mmol/L (ref 101–111)
CO2: 29 mmol/L (ref 22–32)
CREATININE: 0.92 mg/dL (ref 0.61–1.24)
Calcium: 9.4 mg/dL (ref 8.9–10.3)
GFR calc non Af Amer: >60 mL/min (ref >60)
Glucose, Bld: 106 mg/dL — ABNORMAL HIGH (ref 65–99)
POTASSIUM: 4 mmol/L (ref 3.5–5.1)
Sodium: 138 mmol/L (ref 135–145)

## 2015-12-29 LAB — TROPONIN I: Troponin I: <0.03 ng/mL (ref <0.03)

## 2015-12-29 NOTE — ED Triage Notes (Signed)
[facility]. Per EMS upon their arrival patient was in SVT with rate in the 200's. Patient was given 6 mg of Adenosine without relief. Pt was given additional 12 mg of Adenosine with conversion of rhythm and rate in the 80's. Upon arrival to ED patient was in no acute distress and in NSR with rate in the 70's.

## 2015-12-29 NOTE — ED Provider Notes (Signed)
[facility] Emergency Department Provider Note   ____________________________________________   I have reviewed the triage vital signs and the nursing notes.   HISTORY  Chief Complaint Loss of Consciousness SVT  History limited by: Not Limited   HPI [name] [name] is a 60 y.o. male who presents to the emergency department today via EMS after an episode of SVT with a syncopal episode. The patient has a history of infrequent SVT. States last episode was roughly 2 years ago. It happened today while the patient was at work. He denies any chest pain when this happens however he can sense a change. He did have a syncopal episode when he was walking around after it started. The patient was given 18mg  adenosine by EMS which broke the SVT. At the time of my exam the patient did state he was feeling better.   Past Medical History:  Diagnosis Date  . Arrhythmia    SVT  . SVT (supraventricular tachycardia) [facility](HCC)     Patient Active Problem List   Diagnosis Date Noted  . SVT/ PSVT/ PAT 08/08/2008    Past Surgical History:  Procedure Laterality Date  . KNEE SURGERY      Prior to Admission medications   Medication Sig Start Date End Date Taking? Authorizing Provider  aspirin 325 MG tablet Take 325 mg by mouth daily.    Historical Provider, MD  metoprolol succinate (TOPROL XL) 50 MG 24 hr tablet Take 1 tablet (50 mg total) by mouth daily. Take with or immediately following a meal. 03/28/11 03/27/12  Marinus MawGregg W Taylor, MD    Allergies Patient has no known allergies.  No family history on file.  Social History Social History  Substance Use Topics  . Smoking status: Never Smoker  . Smokeless tobacco: Never Used  . Alcohol use Yes    Review of Systems  Constitutional: Negative for fever. Cardiovascular: Negative for chest pain. Respiratory: Negative for shortness of breath. Gastrointestinal: Negative for abdominal pain, vomiting and diarrhea. Genitourinary:  Negative for dysuria. Musculoskeletal: Negative for back pain. Skin: Negative for rash. Neurological: Negative for headaches, focal weakness or numbness.  10-point ROS otherwise negative.  ____________________________________________   PHYSICAL EXAM:  VITAL SIGNS: ED Triage Vitals  Enc Vitals Group     BP 12/29/15 1209 134/90     Pulse Rate 12/29/15 1209 75     Resp 12/29/15 1209 16     Temp 12/29/15 1209 98.2 F (36.8 C)     Temp Source 12/29/15 1209 Oral     SpO2 12/29/15 1209 96 %     Weight 12/29/15 1210 239 lb 4.8 oz (108.5 kg)     Height 12/29/15 1210 6\' 3"  (1.905 m)   Constitutional: Alert and oriented. Well appearing and in no distress. Eyes: Conjunctivae are normal. Normal extraocular movements. ENT   Head: Normocephalic and atraumatic.   Nose: No congestion/rhinnorhea.   Mouth/Throat: Mucous membranes are moist.   Neck: No stridor. Hematological/Lymphatic/Immunilogical: No cervical lymphadenopathy. Cardiovascular: Normal rate, regular rhythm.  No murmurs, rubs, or gallops.  Respiratory: Normal respiratory effort without tachypnea nor retractions. Breath sounds are clear and equal bilaterally. No wheezes/rales/rhonchi. Gastrointestinal: Soft and nontender. No distention.  Genitourinary: Deferred Musculoskeletal: Normal range of motion in all extremities.  Neurologic:  Normal speech and language. No gross focal neurologic deficits are appreciated.  Skin:  Skin is warm, dry and intact. No rash noted. Psychiatric: Mood and affect are normal. Speech and behavior are normal. Patient exhibits appropriate insight and judgment.  ____________________________________________    LABS (pertinent positives/negatives)  Labs Reviewed  BASIC METABOLIC PANEL - Abnormal; Notable for the following:       Result Value   Glucose, Bld 106 (*)    All other components within normal limits  CBC  TROPONIN I     ____________________________________________   EKG  I, Phineas SemenGraydon Alejandro Gamel, attending physician, personally viewed and interpreted this EKG  EKG Time: 1205 Rate: 74 Rhythm: normal sinus rhythm Axis: left axis deviation Intervals: qtc 424 QRS: LAFB ST changes: no st elevation Impression: abnormal ekg   ____________________________________________    RADIOLOGY  None  ____________________________________________   PROCEDURES  Procedures  ____________________________________________   INITIAL IMPRESSION / ASSESSMENT AND PLAN / ED COURSE  Pertinent labs & imaging results that were available during my care of the patient were reviewed by me and considered in my medical decision making (see chart for details).  Patient with SVT broke with adenosine given by EMS. Blood work without concerning findings. Patient did state he had tea this morning which potentially caused the SVT, patient rarely has caffeine. Will discharge to follow up with cardiology.    ____________________________________________   FINAL CLINICAL IMPRESSION(S) / ED DIAGNOSES  Final diagnoses:  SVT (supraventricular tachycardia) (HCC)     Note: This dictation was prepared with Dragon dictation. Any transcriptional errors that result from this process are unintentional     Phineas SemenGraydon Manasvini Whatley, MD 12/29/15 1314

## 2015-12-29 NOTE — Discharge Instructions (Signed)
Please seek medical attention for any high fevers, chest pain, shortness of breath, change in behavior, persistent vomiting, bloody stool or any other new or concerning symptoms.  

## 2016-02-21 ENCOUNTER — Emergency Department
Admission: EM | Admit: 2016-02-21 | Discharge: 2016-02-22 | Discharge status: Against Medical Advice | Payer: Managed Care, Other (non HMO) | Attending: Nurse Practitioner | Admitting: Nurse Practitioner

## 2016-02-21 ENCOUNTER — Encounter: Payer: Self-pay | Admitting: Emergency Medicine

## 2016-02-21 DIAGNOSIS — Z7982 Long term (current) use of aspirin: Secondary | ICD-10-CM | POA: Diagnosis not present

## 2016-02-21 DIAGNOSIS — I998 Other disorder of circulatory system: Secondary | ICD-10-CM | POA: Diagnosis not present

## 2016-02-21 DIAGNOSIS — R000 Tachycardia, unspecified: Secondary | ICD-10-CM | POA: Diagnosis present

## 2016-02-21 DIAGNOSIS — I471 Supraventricular tachycardia: Secondary | ICD-10-CM

## 2016-02-21 LAB — CBC
HCT: 44.7 % (ref 40.0–52.0)
HEMOGLOBIN: 15.9 g/dL (ref 13.0–18.0)
MCH: 32.1 pg (ref 26.0–34.0)
MCHC: 35.6 g/dL (ref 32.0–36.0)
MCV: 90.1 fL (ref 80.0–100.0)
PLATELETS: 300 10*3/uL (ref 150–440)
RBC: 4.96 MIL/uL (ref 4.40–5.90)
RDW: 12.9 % (ref 11.5–14.5)
WBC: 11.7 10*3/uL — ABNORMAL HIGH (ref 3.8–10.6)

## 2016-02-21 LAB — BASIC METABOLIC PANEL
BUN: 16 mg/dL (ref 6–20)
CALCIUM: 9.7 mg/dL (ref 8.9–10.3)
CHLORIDE: 103 mmol/L (ref 101–111)
CO2: 25 mmol/L (ref 22–32)
CREATININE: 1.02 mg/dL (ref 0.61–1.24)
Glucose, Bld: 144 mg/dL — ABNORMAL HIGH (ref 65–99)
Potassium: 3.7 mmol/L (ref 3.5–5.1)
SODIUM: UNDETERMINED mmol/L (ref 135–145)

## 2016-02-21 LAB — TROPONIN I

## 2016-02-21 MED ORDER — SODIUM CHLORIDE 0.9 % IV BOLUS (SEPSIS)
1000.0000 mL | Freq: Once | INTRAVENOUS | Status: AC
  Administered 2016-02-21: 1000 mL via INTRAVENOUS

## 2016-02-21 MED ORDER — METOPROLOL TARTRATE 50 MG PO TABS
50.0000 mg | ORAL_TABLET | Freq: Once | ORAL | Status: AC
  Administered 2016-02-21: 50 mg via ORAL
  Filled 2016-02-21: qty 1

## 2016-02-21 NOTE — ED Provider Notes (Signed)
[facility] Emergency Department Provider Note  ____________________________________________  Time seen: Approximately 11:09 PM  I have reviewed the triage vital signs and the nursing notes.   HISTORY  Chief Complaint Supraventicular tachycardia    HPI [name] [name] is a 61 y.o. male with a history of recurrent SVT presenting with SVT. The patient reports that he was under significant stress driving in a snowstorm when he had acute onset of a warm sensation in the epigastric area, typical for his SVT. He pulled into a gas station and EMS found him to have a heart rate between 150s to 170s. They placed an IV which was tenuous, and he had no changes with 6 mg and 12 mg of adenosine. He was given Cardizem in the parking lot without any changes. On arrival to the ED, his heart rate had normalized to the high 50s and low 60s and his symptoms had completely resolved. He denies any chest pain, shortness of breath, palpitations, lightheadedness. He did have significant diaphoresis.   Past Medical History:  Diagnosis Date  . Arrhythmia    SVT  . SVT (supraventricular tachycardia) [facility](HCC)     Patient Active Problem List   Diagnosis Date Noted  . SVT/ PSVT/ PAT 08/08/2008    Past Surgical History:  Procedure Laterality Date  . KNEE SURGERY      Current Outpatient Rx  . Order #: [id number] Class: Historical Med    Allergies Patient has no known allergies.  No family history on file.  Social History Social History  Substance Use Topics  . Smoking status: Never Smoker  . Smokeless tobacco: Never Used  . Alcohol use Yes    Review of Systems Constitutional: No fever/chills.  No lightheadedness or syncope. Positive diaphoresis. Eyes: No visual changes. ENT: No sore throat. No congestion or rhinorrhea. Cardiovascular: Denies chest pain. Denies palpitations. Respiratory: Denies shortness of breath.  No cough. Gastrointestinal: No abdominal pain.  Positive  epigastric "warmth."  No nausea, no vomiting.  No diarrhea.  No constipation. Genitourinary: Negative for dysuria. Musculoskeletal: Negative for back pain. Skin: Negative for rash. Neurological: Negative for headaches. No focal numbness, tingling or weakness.   10-point ROS otherwise negative.  ____________________________________________   PHYSICAL EXAM:  VITAL SIGNS: ED Triage Vitals  Enc Vitals Group     BP 02/21/16 2206 123/66     Pulse Rate 02/21/16 2206 69     Resp 02/21/16 2206 18     Temp 02/21/16 2206 97.3 F (36.3 C)     Temp Source 02/21/16 2206 Oral     SpO2 02/21/16 2206 97 %     Weight 02/21/16 2210 240 lb (108.9 kg)     Height 02/21/16 2210 6\' 3"  (1.905 m)     Head Circumference --      Peak Flow --      Pain Score --      Pain Loc --      Pain Edu? --      Excl. in GC? --     Constitutional: Alert and oriented. Answers questions appropriately.Diaphoretic and pale, uncomfortable appearing. Eyes: Conjunctivae are normal.  EOMI. No scleral icterus. Head: Atraumatic. Nose: No congestion/rhinnorhea. Mouth/Throat: Mucous membranes are moist.  Neck: No stridor.  Supple.  No JVD. Cardiovascular: Normal rate, regular rhythm. No murmurs, rubs or gallops.  Respiratory: Normal respiratory effort.  No accessory muscle use or retractions. Lungs CTAB.  No wheezes, rales or ronchi. Gastrointestinal: Soft, nontender and nondistended.  No guarding or rebound.  No peritoneal signs. Musculoskeletal: No LE edema. No ttp in the calves or palpable cords.  Negative Homan's sign. Neurologic:  A&Ox3.  Speech is clear.  Face and smile are symmetric.  EOMI.  Moves all extremities well. Skin:  Skin is warm, dry and intact. No rash noted. Psychiatric: Mood and affect are normal. Speech and behavior are normal.  Normal judgement.  ____________________________________________   LABS (all labs ordered are listed, but only abnormal results are displayed)  Labs Reviewed  CBC -  Abnormal; Notable for the following:       Result Value   WBC 11.7 (*)    All other components within normal limits  BASIC METABOLIC PANEL  TROPONIN I   ____________________________________________  EKG  ED ECG REPORT I, Rockne Menghini, the attending physician, personally viewed and interpreted this ECG.   Date: 02/21/2016  EKG Time: 2159  Rate: 59  Rhythm: sinus bradycardia  Axis: normal  Intervals:none  ST&T Change: 4 mm ST depressions in V3, V4, V5, V6. 2 mm ST elevation in aVR. 1-2 mm ST depressions in lead 1 and 2.  ED ECG REPORT I, Rockne Menghini, the attending physician, personally viewed and interpreted this ECG.   Date: 02/21/2016  EKG Time: 2202  Rate: 70  Rhythm: normal sinus rhythm  Axis: normal  Intervals:first-degree A-V block   ST&T Change: 1 mm ST elevation in V1. 2 mm ST elevation in aVR. 2 mm ST depression in V3, V4, V5, 1-2 mm ST depression in 1 and 2.  ED ECG REPORT I, Rockne Menghini, the attending physician, personally viewed and interpreted this ECG.   Date: 02/21/2016  EKG Time: 2207  Rate: 66  Rhythm: normal sinus rhythm  Axis: normal  Intervals:none  ST&T Change: 0.5 mm ST depression in V4 and V5.    ____________________________________________  RADIOLOGY  No results found.  ____________________________________________   PROCEDURES  Procedure(s) performed: None  Procedures  Critical Care performed: Yes, see critical care note(s) ____________________________________________   INITIAL IMPRESSION / ASSESSMENT AND PLAN / ED COURSE  Pertinent labs & imaging results that were available during my care of the patient were reviewed by me and considered in my medical decision making (see chart for details).  61 y.o. male with a history of recurrent SVT, 7 episodes the last 2 years, presenting with acute onset of SVT, now with a normalized rate. On arrival, the patient was diaphoretic, pale, and uncomfortable  appearing with tachycardia. His IV was dysfunctional, but it is likely that he did receive some Cardizem, possibly subcutaneously. At this time, he is rate controlled and I will treat him with metoprolol, 50 mg, which is his home dose to prevent rebound SVT. This patient has discussed ablation with his cardiologist in the past and is likely a candidate for this. Given that he had significant EKG changes even with a normalized heart rate, I'm concerned about demand ischemia and will continue to monitor the patient overnight for further cardiac evaluation. I've spoken with Dr. Ladona Ridgel, the cardiologist on-call, who agrees with the plan. The patient be given aspirin. Plan admission.  ____________________________________________  FINAL CLINICAL IMPRESSION(S) / ED DIAGNOSES  Final diagnoses:  SVT (supraventricular tachycardia) (HCC)  Ischemia    Clinical Course       NEW MEDICATIONS STARTED DURING THIS VISIT:  New Prescriptions   No medications on file      Rockne Menghini, MD 02/21/16 2319

## 2016-02-21 NOTE — ED Triage Notes (Addendum)
Pt presents to ED from [employer] via [facility] from supraventricular tachycardia, pt has hx of the same, reports this is the 7th episode in 10 years. EMS administered 6 mg of Adenosine and another 12 mg of Adenosine without relief. Pt was given 20 mg of Cardizem upon ED arrival by EMS. Pt reports felt a warm rush feeling and states "I knew I had it" Pt arrives diaphoretic at ED. Pt arrives alert and oriented x 4, respirations even and unlabored. Dr. Sharma CovertNorman at bedside. Pt arrived to ED in normal sinus rhythm. Pt denies chest pain, headache, or shortness of breath.

## 2016-02-22 NOTE — ED Notes (Signed)
Pt declined admission, pt signed AMA form.  Pt educated to call 911 or seek medical attention for any chest pain, elevated heart rate or any other symptoms similar to him coming in to the ER tonight.  Pt in NAD and ambulatory to lobby.

## 2016-02-22 NOTE — Progress Notes (Signed)
Patient ID: [name] [name], male   DOB: 08/01/1955, 61 y.o.   MRN: [id number] Late entry - patient seen at 11:30PM on 02/21/16   Called by ED physician to admit patient for SVT with EKG changes c/w ischemia.  On my arrival to the room the patient is adamant that he leave the hospital stating that he has a high deductible insurance plan and is still making payments on his last hospitalization.  He plans to leave the ED, sleep in the waiting room and return if his symptoms recur.  He states that he has plans to see a [facility]ologist at [facility] for [facility] ablation.    I explained to him that I would not be comfortable discharging him and that if he is going to leave the hospital he would be responsible to sign out against medical advice.  I explained his condition and the risks of noncompliance with the proposed evaluation and management including worsening of his condition and even possibly death.  He expressed understanding of the risks and remained adamant about leaving stating the his symptoms had resolved and he felt better.  I encouraged him to seek immediate medical attention for any recurrent or worrisome symptoms and to follow up with him PMD and cardiologist as soon as possible.  He signed out AMA.   - [employer] Berley Gambrell, D.O.

## 2016-02-22 NOTE — ED Provider Notes (Signed)
The patient was seen by the admitting doctor but does not want to stay in the hospital. The patient will be signed out AGAINST MEDICAL ADVICE. Please see note written by Dr. Abagail KitchensHugelmeyer   [name] Gramajo P Tatia Petrucci, MD 02/22/16 [phone]

## 2016-03-22 NOTE — Progress Notes (Signed)
Cardiology Office Note   Date:  03/25/2016   ID:  [name] [name], DOB 21-Mar-1955, MRN [id number]  PCP:  Pcp Not In System  Cardiologist:   Mariah Milling / Ladona Ridgel last seen 2013 and 2014   No chief complaint on file.     History of Present Illness: [name] [name] is a 61 y.o. male who presents for SVT.  Seen in [place] ER 02/21/16  Acute onset of warm sensation in epigastric area typical for his SVT. He was driving and pulled over Rx EMS with adenosine and cardizem No immediate conversion but on arrival to ER was in NSR Narrow complex tachycardia rates 150-170.   Non smoker Occasional ETOH 7 episodes in last 2 years   Post conversion ECG had marked inferior lateral ST depression. Patient left AMA And declined admission No chest pain troponin negative  Seen by Dr Mariah Milling 2014  Similar ECG abnormality post conversion with troponin 7    Cath 04/03/12 No CAD   When seen by Dr Ladona Ridgel he recommended use of beta blockers as needed and consider Ablation for more frequent episodes Has now had episode in November and January   Past Medical History:  Diagnosis Date  . Arrhythmia    SVT  . SVT (supraventricular tachycardia) (HCC)     Past Surgical History:  Procedure Laterality Date  . KNEE SURGERY       Current Outpatient Prescriptions  Medication Sig Dispense Refill  . aspirin EC 81 MG tablet Take 81 mg by mouth daily.     No current facility-administered medications for this visit.     Allergies:   Patient has no known allergies.    Social History:  The patient  reports that he has never smoked. He has never used smokeless tobacco. He reports that he drinks alcohol. He reports that he does not use drugs.   Family History:  The patient's family history includes Cancer - Ovarian in his mother.    ROS:  Please see the history of present illness.   Otherwise, review of systems are positive for none.   All other systems are reviewed and negative.    PHYSICAL EXAM: VS:  BP 124/70    Pulse 66   Ht 6\' 3"  (1.905 m)   Wt 247 lb (112 kg)   SpO2 98%   BMI 30.87 kg/m  , BMI Body mass index is 30.87 kg/m. Affect appropriate Healthy:  appears stated age HEENT: normal Neck supple with no adenopathy JVP normal no bruits no thyromegaly Lungs clear with no wheezing and good diaphragmatic motion Heart:  S1/S2 no murmur, no rub, gallop or click PMI normal Abdomen: benighn, BS positve, no tenderness, no AAA no bruit.  No HSM or HJR Distal pulses intact with no bruits No edema Neuro non-focal Skin warm and dry No muscular weakness    EKG: SR inferior lateal ST depression 02/22/16     Recent Labs: 02/21/2016: BUN 16; Creatinine, Ser 1.02; Hemoglobin 15.9; Platelets 300; Potassium 3.7; Sodium UNABLE TO REPORT DUE TO LIPEMIC INTERFERENCE    Lipid Panel No results found for: CHOL, TRIG, HDL, CHOLHDL, VLDL, LDLCALC, LDLDIRECT    Wt Readings from Last 3 Encounters:  03/25/16 247 lb (112 kg)  02/21/16 240 lb (108.9 kg)  12/29/15 239 lb 4.8 oz (108.5 kg)      Other studies Reviewed: Additional studies/ records that were reviewed today include: Previous notes ER, Dr Mariah Milling And Dr Ladona Ridgel. Labs and ECG [place] .  ASSESSMENT AND PLAN:  1.  SVT refer to Dr Ladona Ridgelaylor for ablation patient willing to proceed at this time He is aware of vagal Maneuvers and uses them  2. Abnormal ECG ? Related to demand ischemia with prolonged SVT. He has had normal cath in 2008 and 2014 The latter with same ECG changes and elevated troponin. Will leave up to Dr Ladona Ridgelaylor if he wants ischemic evaluaiton Before ablation would recommend cardiac CTA    Current medicines are reviewed at length with the patient today.  The patient does not have concerns regarding medicines.  The following changes have been made:  no change  Labs/ tests ordered today include: Refer to Dr Ladona Ridgelaylor for Ablation   No orders of the defined types were placed in this encounter.    Disposition:   FU with Dr Ladona Ridgelaylor        Signed, Charlton HawsPeter Kida Digiulio, MD  03/25/2016 2:28 PM    [facility] [address], [place], [place]  [address] Phone: [phone]) [phone]; Fax: [phone]) [phone]

## 2016-03-25 ENCOUNTER — Ambulatory Visit (INDEPENDENT_AMBULATORY_CARE_PROVIDER_SITE_OTHER): Payer: Managed Care, Other (non HMO) | Admitting: Cardiovascular Disease

## 2016-03-25 ENCOUNTER — Encounter: Payer: Self-pay | Admitting: Cardiovascular Disease

## 2016-03-25 VITALS — BP 124/70 | HR 66 | Ht 75.0 in | Wt 247.0 lb

## 2016-03-25 DIAGNOSIS — I471 Supraventricular tachycardia: Secondary | ICD-10-CM | POA: Diagnosis not present

## 2016-03-25 NOTE — Patient Instructions (Signed)
Your physician recommends that you schedule a follow-up appointment with Dr. Ladona Ridgelaylor.   Your physician recommends that you continue on your current medications as directed. Please refer to the Current Medication list given to you today.  If you need a refill on your cardiac medications before your next appointment, please call your pharmacy.  Thank you for choosing [facility]!

## 2016-05-02 ENCOUNTER — Ambulatory Visit (INDEPENDENT_AMBULATORY_CARE_PROVIDER_SITE_OTHER): Payer: Managed Care, Other (non HMO) | Admitting: Internal Medicine

## 2016-05-02 ENCOUNTER — Encounter: Payer: Self-pay | Admitting: Internal Medicine

## 2016-05-02 ENCOUNTER — Encounter: Payer: Self-pay | Admitting: *Deleted

## 2016-05-02 VITALS — BP 160/90 | HR 80 | Ht 75.0 in | Wt 243.0 lb

## 2016-05-02 DIAGNOSIS — I471 Supraventricular tachycardia: Secondary | ICD-10-CM | POA: Diagnosis not present

## 2016-05-02 NOTE — Patient Instructions (Signed)
Your physician has recommended that you have an ablation. Catheter ablation is a medical procedure used to treat some cardiac arrhythmias (irregular heartbeats). During catheter ablation, a long, thin, flexible tube is put into a blood vessel in your groin (upper thigh), or neck. This tube is called an ablation catheter. It is then guided to your heart through the blood vessel. Radio frequency waves destroy small areas of heart tissue where abnormal heartbeats may cause an arrhythmia to start. Please see the instruction sheet given to you today.  Your physician recommends that you return for lab work in: (05/06/16)   If you need a refill on your cardiac medications before your next appointment, please call your pharmacy.  Thank you for choosing [facility]!

## 2016-05-02 NOTE — Progress Notes (Signed)
      HPI [name] returns today after an over 5 year absence from our EP clinic. He is a healthy 61 yo man with a h/o SVT dating back 10 years. He has had multiple ER visits for SVT which have been treated with IV adenosine. He gets sob and chest pressure with the episodes. No other complaints. He has not had frank syncope. He does have an elevation in his troponin with the episodes. No CAD. No Known Allergies   Current Outpatient Prescriptions  Medication Sig Dispense Refill  . aspirin EC 81 MG tablet Take 81 mg by mouth daily.     No current facility-administered medications for this visit.      Past Medical History:  Diagnosis Date  . Arrhythmia    SVT  . SVT (supraventricular tachycardia) (HCC)     ROS:   All systems reviewed and negative except as noted in the HPI.   Past Surgical History:  Procedure Laterality Date  . KNEE SURGERY       Family History  Problem Relation Age of Onset  . Cancer - Ovarian Mother      Social History   Social History  . Marital status: Married    Spouse name: N/A  . Number of children: N/A  . Years of education: N/A   Occupational History  . Not on file.   Social History Main Topics  . Smoking status: Never Smoker  . Smokeless tobacco: Never Used  . Alcohol use Yes  . Drug use: No  . Sexual activity: Not on file   Other Topics Concern  . Not on file   Social History Narrative  . No narrative on file     BP (!) 160/90   Pulse 80   Ht 6' 3" (1.905 m)   Wt 243 lb (110.2 kg)   SpO2 95%   BMI 30.37 kg/m   Physical Exam:  Well appearing 61 yo man, NAD HEENT: Unremarkable Neck:  6 cm JVD, no thyromegally Lymphatics:  No adenopathy Back:  No CVA tenderness Lungs:  Clear with no wheezes HEART:  Regular rate rhythm, no murmurs, no rubs, no clicks Abd:  soft, positive bowel sounds, no organomegally, no rebound, no guarding Ext:  2 plus pulses, no edema, no cyanosis, no clubbing Skin:  No rashes no  nodules Neuro:  CN II through XII intact, motor grossly intact  EKG  -NSR with normal axis.  Assess/Plan: 1. Recurrent symptomatic SVT - I have discussed the treatment options with the patient and I have recommended proceeding with EP study and catheter ablation of his SVT. I have discussed the risks/benefits/goals/expectations of catheter ablation and he wishes to proceed.  

## 2016-05-06 LAB — CBC WITH DIFFERENTIAL/PLATELET
BASOS PCT: 1 %
Basophils Absolute: 53 cells/uL (ref 0–200)
EOS ABS: 159 {cells}/uL (ref 15–500)
Eosinophils Relative: 3 %
HEMATOCRIT: 45.6 % (ref 38.5–50.0)
HEMOGLOBIN: 15.5 g/dL (ref 13.2–17.1)
LYMPHS ABS: 2279 {cells}/uL (ref 850–3900)
Lymphocytes Relative: 43 %
MCH: 31.1 pg (ref 27.0–33.0)
MCHC: 34 g/dL (ref 32.0–36.0)
MCV: 91.6 fL (ref 80.0–100.0)
MONO ABS: 371 {cells}/uL (ref 200–950)
MPV: 9.2 fL (ref 7.5–12.5)
Monocytes Relative: 7 %
NEUTROS ABS: 2438 {cells}/uL (ref 1500–7800)
Neutrophils Relative %: 46 %
Platelets: 252 10*3/uL (ref 140–400)
RBC: 4.98 MIL/uL (ref 4.20–5.80)
RDW: 13.2 % (ref 11.0–15.0)
WBC: 5.3 10*3/uL (ref 3.8–10.8)

## 2016-05-06 LAB — BASIC METABOLIC PANEL
BUN: 15 mg/dL (ref 7–25)
CO2: 28 mmol/L (ref 20–31)
Calcium: 9.8 mg/dL (ref 8.6–10.3)
Chloride: 101 mmol/L (ref 98–110)
Creat: 0.88 mg/dL (ref 0.70–1.25)
GLUCOSE: 141 mg/dL — AB (ref 65–99)
POTASSIUM: 4.5 mmol/L (ref 3.5–5.3)
SODIUM: 138 mmol/L (ref 135–146)

## 2016-05-06 LAB — PROTIME-INR
INR: 1
Prothrombin Time: 10.8 s (ref 9.0–11.5)

## 2016-05-13 ENCOUNTER — Encounter (HOSPITAL_COMMUNITY): Payer: Self-pay | Admitting: *Deleted

## 2016-05-13 ENCOUNTER — Encounter (HOSPITAL_COMMUNITY)
Admission: RE | Disposition: A | Discharge status: Home / Self Care | Payer: Self-pay | Source: Ambulatory Visit | Attending: Internal Medicine

## 2016-05-13 ENCOUNTER — Ambulatory Visit (HOSPITAL_COMMUNITY)
Admission: RE | Admit: 2016-05-13 | Discharge: 2016-05-13 | Disposition: A | Discharge status: Home / Self Care | Payer: Managed Care, Other (non HMO) | Source: Ambulatory Visit | Attending: Internal Medicine | Admitting: Internal Medicine

## 2016-05-13 DIAGNOSIS — Z7982 Long term (current) use of aspirin: Secondary | ICD-10-CM | POA: Insufficient documentation

## 2016-05-13 DIAGNOSIS — I471 Supraventricular tachycardia: Secondary | ICD-10-CM | POA: Diagnosis present

## 2016-05-13 HISTORY — PX: SVT ABLATION: EP1225

## 2016-05-13 SURGERY — SVT ABLATION

## 2016-05-13 MED ORDER — MIDAZOLAM HCL 5 MG/5ML IJ SOLN
INTRAMUSCULAR | Status: AC
  Filled 2016-05-13: qty 5

## 2016-05-13 MED ORDER — SODIUM CHLORIDE 0.9 % IV SOLN: 250.0000 mL | INTRAVENOUS | Status: DC | PRN

## 2016-05-13 MED ORDER — ONDANSETRON HCL 4 MG/2ML IJ SOLN: 4.0000 mg | Freq: Four times a day (QID) | INTRAMUSCULAR | Status: DC | PRN

## 2016-05-13 MED ORDER — ASPIRIN EC 81 MG PO TBEC: 81.0000 mg | DELAYED_RELEASE_TABLET | Freq: Every day | ORAL | Status: DC

## 2016-05-13 MED ORDER — FENTANYL CITRATE (PF) 100 MCG/2ML IJ SOLN
INTRAMUSCULAR | Status: AC
  Filled 2016-05-13: qty 2

## 2016-05-13 MED ORDER — IBUPROFEN 200 MG PO TABS: 200.0000 mg | ORAL_TABLET | Freq: Every day | ORAL | Status: DC | PRN

## 2016-05-13 MED ORDER — ACETAMINOPHEN 325 MG PO TABS: 650.0000 mg | ORAL_TABLET | ORAL | Status: DC | PRN

## 2016-05-13 MED ORDER — SODIUM CHLORIDE 0.9 % IV SOLN
INTRAVENOUS | Status: DC
  Administered 2016-05-13: 08:00:00 via INTRAVENOUS

## 2016-05-13 MED ORDER — MIDAZOLAM HCL 5 MG/5ML IJ SOLN
INTRAMUSCULAR | Status: DC | PRN
  Administered 2016-05-13 (×9): 1 mg via INTRAVENOUS

## 2016-05-13 MED ORDER — BUPIVACAINE HCL (PF) 0.25 % IJ SOLN
INTRAMUSCULAR | Status: AC
  Filled 2016-05-13: qty 60

## 2016-05-13 MED ORDER — SODIUM CHLORIDE 0.9% FLUSH: 3.0000 mL | INTRAVENOUS | Status: DC | PRN

## 2016-05-13 MED ORDER — HEPARIN (PORCINE) IN NACL 2-0.9 UNIT/ML-% IJ SOLN
INTRAMUSCULAR | Status: DC | PRN
  Administered 2016-05-13: 10:00:00

## 2016-05-13 MED ORDER — BUPIVACAINE HCL (PF) 0.25 % IJ SOLN
INTRAMUSCULAR | Status: DC | PRN
  Administered 2016-05-13: 10 mL
  Administered 2016-05-13: 25 mL

## 2016-05-13 MED ORDER — FENTANYL CITRATE (PF) 100 MCG/2ML IJ SOLN
INTRAMUSCULAR | Status: DC | PRN
  Administered 2016-05-13 (×4): 12.5 ug via INTRAVENOUS
  Administered 2016-05-13: 25 ug via INTRAVENOUS
  Administered 2016-05-13 (×3): 12.5 ug via INTRAVENOUS

## 2016-05-13 MED ORDER — ISOPROTERENOL HCL 0.2 MG/ML IJ SOLN
INTRAMUSCULAR | Status: AC
  Filled 2016-05-13: qty 5

## 2016-05-13 MED ORDER — HEPARIN (PORCINE) IN NACL 2-0.9 UNIT/ML-% IJ SOLN
INTRAMUSCULAR | Status: AC
  Filled 2016-05-13: qty 500

## 2016-05-13 MED ORDER — SODIUM CHLORIDE 0.9% FLUSH: 3.0000 mL | Freq: Two times a day (BID) | INTRAVENOUS | Status: DC

## 2016-05-13 SURGICAL SUPPLY — 12 items
BAG SNAP BAND KOVER 36X36 (MISCELLANEOUS) ×3 IMPLANT
CATH CELSIUS THERM D CV 7F (ABLATOR) ×3 IMPLANT
CATH HEX JOS 2-5-2 65CM 6F REP (CATHETERS) ×3 IMPLANT
CATH JOSEPHSON QUAD-ALLRED 6FR (CATHETERS) ×6 IMPLANT
HOVERMATT SINGLE USE (MISCELLANEOUS) ×3 IMPLANT
PACK EP LATEX FREE (CUSTOM PROCEDURE TRAY) ×2
PACK EP LF (CUSTOM PROCEDURE TRAY) ×1 IMPLANT
PAD DEFIB LIFELINK (PAD) ×3 IMPLANT
SHEATH PINNACLE 6F 10CM (SHEATH) ×6 IMPLANT
SHEATH PINNACLE 7F 10CM (SHEATH) ×3 IMPLANT
SHEATH PINNACLE 8F 10CM (SHEATH) ×3 IMPLANT
SHIELD RADPAD SCOOP 12X17 (MISCELLANEOUS) ×3 IMPLANT

## 2016-05-13 NOTE — Progress Notes (Signed)
Site area: Right groin a 6 french X2, and a 8 french sheath was removed  Site Prior to Removal:  Level 0  Pressure Applied For 15 MINUTES    Bedrest Beginning at 1205p  Manual:   Yes.    Patient Status During Pull:  stable  Post Pull Groin Site:  Level 0  Post Pull Instructions Given:  Yes.    Post Pull Pulses Present:  Yes.    Dressing Applied:  Yes.    Comments:  VS remain stable during sheath pull.

## 2016-05-13 NOTE — Progress Notes (Signed)
Site area: Right Internal Jugular a 7 french venous sheath was removed  Site Prior to Removal:  Level 0  Pressure Applied For 10 MINUTES    Bedrest Beginning at 1205p  Manual:   Yes.    Patient Status During Pull:  stable  Post Pull Groin Site:  Level 0  Post Pull Instructions Given:  Yes.    Post Pull Pulses Present:  Yes.    Dressing Applied:  Yes.    Comments:  VS remain stable

## 2016-05-13 NOTE — Progress Notes (Signed)
The patient is seen post procedure He is feeling well without CP, palpitations or SOB. Activity restrictions, wound care were discussed with the patient VSS Procedure sites are stable, no bleeding hematomas or pain, they are non-tender. Follow up has been arranged Plan for discharge when bed rest is completed.  Francis Dowseenee Ursuy, PA-C  EP Attending  Patient seen and examined. Agree with above. He is stable for DC after EP study and catheter ablation.  Leonia ReevesGregg Annalia Metzger,M.D.

## 2016-05-13 NOTE — H&P (View-Only) (Signed)
      HPI [name] returns today after an over 5 year absence from our EP clinic. He is a healthy 61 yo man with a h/o SVT dating back 10 years. He has had multiple ER visits for SVT which have been treated with IV adenosine. He gets sob and chest pressure with the episodes. No other complaints. He has not had frank syncope. He does have an elevation in his troponin with the episodes. No CAD. No Known Allergies   Current Outpatient Prescriptions  Medication Sig Dispense Refill  . aspirin EC 81 MG tablet Take 81 mg by mouth daily.     No current facility-administered medications for this visit.      Past Medical History:  Diagnosis Date  . Arrhythmia    SVT  . SVT (supraventricular tachycardia) (HCC)     ROS:   All systems reviewed and negative except as noted in the HPI.   Past Surgical History:  Procedure Laterality Date  . KNEE SURGERY       Family History  Problem Relation Age of Onset  . Cancer - Ovarian Mother      Social History   Social History  . Marital status: Married    Spouse name: N/A  . Number of children: N/A  . Years of education: N/A   Occupational History  . Not on file.   Social History Main Topics  . Smoking status: Never Smoker  . Smokeless tobacco: Never Used  . Alcohol use Yes  . Drug use: No  . Sexual activity: Not on file   Other Topics Concern  . Not on file   Social History Narrative  . No narrative on file     BP (!) 160/90   Pulse 80   Ht 6\' 3"  (1.905 m)   Wt 243 lb (110.2 kg)   SpO2 95%   BMI 30.37 kg/m   Physical Exam:  Well appearing 61 yo man, NAD HEENT: Unremarkable Neck:  6 cm JVD, no thyromegally Lymphatics:  No adenopathy Back:  No CVA tenderness Lungs:  Clear with no wheezes HEART:  Regular rate rhythm, no murmurs, no rubs, no clicks Abd:  soft, positive bowel sounds, no organomegally, no rebound, no guarding Ext:  2 plus pulses, no edema, no cyanosis, no clubbing Skin:  No rashes no  nodules Neuro:  CN II through XII intact, motor grossly intact  EKG  -NSR with normal axis.  Assess/Plan: 1. Recurrent symptomatic SVT - I have discussed the treatment options with the patient and I have recommended proceeding with EP study and catheter ablation of his SVT. I have discussed the risks/benefits/goals/expectations of catheter ablation and he wishes to proceed.

## 2016-05-13 NOTE — Progress Notes (Signed)
Dr Ladona Ridgelaylor in and ok to discharge home

## 2016-05-13 NOTE — Interval H&P Note (Signed)
History and Physical Interval Note:  05/13/2016 9:10 AM  [name] [name]  has presented today for surgery, with the diagnosis of svt  The various methods of treatment have been discussed with the patient and family. After consideration of risks, benefits and other options for treatment, the patient has consented to  Procedure(s): SVT Ablation (N/A) as a surgical intervention .  The patient's history has been reviewed, patient examined, no change in status, stable for surgery.  I have reviewed the patient's chart and labs.  Questions were answered to the patient's satisfaction.     Lewayne BuntingGregg Taylor

## 2016-05-13 NOTE — Discharge Instructions (Signed)
Cardiac Ablation Cardiac ablation is a procedure to stop some heart tissue from causing problems. The heart has many electrical connections. Sometimes these connections make the heart beat very fast or irregularly. Removing some problem areas can improve the heart rhythm or make it normal. What happens before the procedure?  Follow instructions from your doctor about what you cannot eat or drink.  Ask your doctor about:  Changing or stopping your normal medicines. This is important if you take diabetes medicines or blood thinners.  Taking medicines such as aspirin and ibuprofen. These medicines can thin your blood. Do not take these medicines before your procedure if your doctor tells you not to.  Plan to have someone take you home.  If you will be going home right after the procedure, plan to have someone with you for 24 hours. What happens during the procedure?  To lower your risk of infection:  Your health care team will wash or sanitize their hands.  Your skin will be washed with soap.  Hair may be removed from your neck or groin.  An IV tube will be put into one of your veins.  You will be given a medicine to help you relax (sedative).  Skin on your neck or groin will be numbed.  A cut (incision) will be made in your neck or groin.  A needle will be put through your cut and into a vein in your neck or groin.  A tube (catheter) will be put into the needle. The tube will be moved to your heart. X-rays (fluoroscopy) will be used to help guide the tube.  Small devices (electrodes) on the tip of the tube will send out electrical currents.  Dye may be put through the tube. This helps your surgeon see your heart.  Electrical energy will be used to scar (ablate) some heart tissue. Your surgeon may use:  Heat (radiofrequency energy).  Laser energy.  Extreme cold (cryoablation).  The tube will be taken out.  Pressure will be held on your cut. This helps stop  bleeding.  A bandage (dressing) will be put on your cut. The procedure may vary. What happens after the procedure?  You will be monitored until your medicines have worn off.  Your cut will be watched for bleeding. You will need to lie still for a few hours.  Do not drive for 24 hours or as long as your doctor tells you. Summary  Cardiac ablation is a procedure to stop some heart tissue from causing problems.  Electrical energy will be used to scar (ablate) some heart tissue. This information is not intended to replace advice given to you by your health care provider. Make sure you discuss any questions you have with your health care provider. Document Released: 10/07/2012 Document Revised: 12/25/2015 Document Reviewed: 12/25/2015 Elsevier Interactive Patient Education  2017 [employer] Inc[employer]    Femoral Site Care  Refer to this sheet in the next few weeks. These instructions provide you with information about caring for yourself after your procedure. Your health care provider may also give you more specific instructions. Your treatment has been planned according to current medical practices, but problems sometimes occur. Call your health care provider if you have any problems or questions after your procedure. What can I expect after the procedure? After your procedure, it is typical to have the following:  Bruising at the site that usually fades within 1-2 weeks.  Blood collecting in the tissue (hematoma) that may be painful to the touch.  It should usually decrease in size and tenderness within 1-2 weeks. Follow these instructions at home:  Take medicines only as directed by your health care provider.  You may shower 24-48 hours after the procedure or as directed by your health care provider. Remove the bandage (dressing) and gently wash the site with plain soap and water. Pat the area dry with a clean towel. Do not rub the site, because this may cause bleeding.  Do not take baths,  swim, or use a hot tub until your health care provider approves.  Check your insertion site every day for redness, swelling, or drainage.  Do not apply powder or lotion to the site.  Limit use of stairs to twice a day for the first 2-3 days or as directed by your health care provider.  Do not squat for the first 2-3 days or as directed by your health care provider.  Do not lift over 10 lb (4.5 kg) for 5 days after your procedure or as directed by your health care provider.  Ask your health care provider when it is okay to:  Return to work or school.  Resume usual physical activities or sports.  Resume sexual activity.  Do not drive home if you are discharged the same day as the procedure. Have someone else drive you.  You may drive 24 hours after the procedure unless otherwise instructed by your health care provider.  Do not operate machinery or power tools for 24 hours after the procedure or as directed by your health care provider.  If your procedure was done as an outpatient procedure, which means that you went home the same day as your procedure, a responsible adult should be with you for the first 24 hours after you arrive home.  Keep all follow-up visits as directed by your health care provider. This is important. Contact a health care provider if:  You have a fever.  You have chills.  You have increased bleeding from the site. Hold pressure on the site. Get help right away if:  You have unusual pain at the site.  You have redness, warmth, or swelling at the site.  You have drainage (other than a small amount of blood on the dressing) from the site.  The site is bleeding, and the bleeding does not stop after 30 minutes of holding steady pressure on the site.  Your leg or foot becomes pale, cool, tingly, or numb. This information is not intended to replace advice given to you by your health care provider. Make sure you discuss any questions you have with your  health care provider. Document Released: 10/08/2013 Document Revised: 07/13/2015 Document Reviewed: 08/24/2013 Elsevier Interactive Patient Education  2017 [employer]   No driving for 4 days. No lifting over 5 lbs for 1 week. No vigorous or sexual activity for 1 week. You may return to work on 05/20/16. Keep procedure site clean & dry. If you notice increased pain, swelling, bleeding or pus, call/return!  You may shower, but no soaking baths/hot tubs/pools for 1 week.

## 2016-05-14 ENCOUNTER — Encounter (HOSPITAL_COMMUNITY): Payer: Self-pay | Admitting: Internal Medicine

## 2016-05-20 ENCOUNTER — Telehealth: Payer: Self-pay | Admitting: Internal Medicine

## 2016-05-20 ENCOUNTER — Encounter: Payer: Self-pay | Admitting: *Deleted

## 2016-05-20 NOTE — Telephone Encounter (Signed)
New Message   Per pt was supposed to go back to work today, but per his job needs a signed release form to come back.   Fax: [phone] Attn: Beckie Salts

## 2016-05-20 NOTE — Telephone Encounter (Signed)
Pt calling to obtain a note to return to work.  Documentation on his d/c instructions are not acceptable.  Letter will be completed and faxed as requested.   No driving for 4 days. No lifting over 5 lbs for 1 week. No vigorous or sexual activity for 1 week. You may return to work on 05/20/16. Keep procedure site clean & dry. If you notice increased pain, swelling, bleeding or pus, call/return! You may shower, but no soaking baths/hot tubs/pools for 1 week.

## 2016-05-20 NOTE — Telephone Encounter (Signed)
Letter completed and taken to MR to be faxed.

## 2016-05-23 ENCOUNTER — Encounter: Payer: Self-pay | Admitting: Physician Assistant

## 2016-05-27 ENCOUNTER — Encounter: Payer: Self-pay | Admitting: Physician Assistant

## 2016-06-10 ENCOUNTER — Ambulatory Visit: Payer: Managed Care, Other (non HMO) | Admitting: Physician Assistant

## 2016-06-10 NOTE — Progress Notes (Signed)
Cardiology Office Note Date:  06/12/2016  Patient ID:  [name] [name], DOB 06-08-1955, MRN [id number] PCP:  Pcp Not In System  Cardiologist:  Dr. Mariah Milling Electrophysiologist: Dr. Ladona Ridgel     Chief Complaint: post SVT ablation visit  History of Present Illness: [name] [name] is a 61 y.o. male with history of SVT, h/o Cath 04/03/12 No CAD.  He was initially treated with BB for his SVT though had recurrent event early in the year and underwent EPS/ablation with Dr. Ladona Ridgel 05/13/16.  He comes in today to be seen for Dr. Ladona Ridgel, the pt reports feeling great!  No CP, plpitations or SOB, no dizziness, near syncope or syncope,.  He is walking regularly for exercise as well as very physically active farming.  No exertional intolerances.  He deies any procedure site complications, no pain, swelling or bleeding.   Past Medical History:  Diagnosis Date  . Arrhythmia    SVT  . SVT (supraventricular tachycardia) (HCC)     Past Surgical History:  Procedure Laterality Date  . KNEE SURGERY    . SVT ABLATION N/A 05/13/2016   Procedure: SVT Ablation;  Surgeon: Marinus Maw, MD;  Location: [facility] INVASIVE CV LAB;  Service: Cardiovascular;  Laterality: N/A;    Current Outpatient Prescriptions  Medication Sig Dispense Refill  . aspirin EC 81 MG tablet Take 81 mg by mouth daily.    Marland Kitchen ibuprofen (ADVIL,MOTRIN) 200 MG tablet Take 200 mg by mouth daily as needed for headache or moderate pain.     No current facility-administered medications for this visit.     Allergies:   Patient has no known allergies.   Social History:  The patient  reports that he has never smoked. He has never used smokeless tobacco. He reports that he drinks alcohol. He reports that he does not use drugs.   Family History:  The patient's family history includes Cancer - Ovarian in his mother.  ROS:  Please see the history of present illness.  All other systems are reviewed and otherwise negative.   PHYSICAL EXAM:  VS:  BP  122/90   Pulse (!) 57   Ht  (1.905 m)   Wt 247 lb (112 kg)   BMI 30.87 kg/m  BMI: Body mass index is 30.87 kg/m. Well nourished, well developed, in no acute distress  HEENT: normocephalic, atraumatic  Neck: no JVD, carotid bruits or masses Cardiac:  RRR; no significant murmurs, no rubs, or gallops Lungs: CTA b/l, no wheezing, rhonchi or rales  Abd: soft, nontender MS: no deformity or atrophy Ext: no edema  Skin: warm and dry, no rash Neuro:  No gross deficits appreciated Psych: euthymic mood, full affect  EKG:  Done today and reviewed by myself: SB, 57bpm, PR , QRS , QTc  05/13/16: EPS/Ablation CONCLUSIONS:  1. Sinus rhythm upon presentation.  2. 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 Successful radiofrequency modification of the slow AV nodal pathway  4. No inducible arrhythmias following ablation.  5. No early apparent complications.  08/25/07: TTE SUMMARY - Overall left ventricular systolic function was normal. Left    ventricular ejection fraction was estimated , range being 55    % to 65 %. There were no left ventricular regional Daris    motion abnormalities. Left ventricular Madole thickness was    mildly increased. There was mild focal basal septal  hypertrophy. - The left atrium was mildly dilated.  Recent Labs: 05/06/2016: BUN 15; Creat 0.88; Hemoglobin 15.5; Platelets 252; Potassium 4.5; Sodium 138  No results found for requested labs within last 8760 hours.   CrCl cannot be calculated (Patient's most recent lab result is older than the maximum 21 days allowed.).   Wt Readings from Last 3 Encounters:  06/12/16 247 lb (112 kg)  05/13/16 240 lb (108.9 kg)  05/02/16 243 lb (110.2 kg)     Other studies reviewed: Additional studies/records reviewed today include: summarized  above  ASSESSMENT AND PLAN:  1. SVT     AVNRT, s/p EPS/Ablation by Dr. Ladona Ridgel, 05/13/16      Pt feels great, no recurrent palpitations or tachycardia, no procedural complications  Disposition: F/u with EP service PRN  Current medicines are reviewed at length with the patient today.  The patient did not have any concerns regarding medicines.  Judith Blonder, PA-C 06/12/2016 8:48 AM     [facility] [address] [place] [address] [phone] (office)  [phone] (fax)

## 2016-06-12 ENCOUNTER — Ambulatory Visit (INDEPENDENT_AMBULATORY_CARE_PROVIDER_SITE_OTHER): Payer: Managed Care, Other (non HMO) | Admitting: Physician Assistant

## 2016-06-12 VITALS — BP 122/90 | HR 57 | Ht 75.0 in | Wt 247.0 lb

## 2016-06-12 DIAGNOSIS — I471 Supraventricular tachycardia: Secondary | ICD-10-CM | POA: Diagnosis not present

## 2016-06-12 NOTE — Patient Instructions (Signed)

## 2016-06-12 NOTE — Addendum Note (Signed)
Addended by: Oleta Mouse on: 06/12/2016 09:13 AM   Modules accepted: Orders

## 2018-08-06 DIAGNOSIS — M545 Low back pain: Secondary | ICD-10-CM | POA: Diagnosis not present

## 2018-08-06 DIAGNOSIS — T148XXA Other injury of unspecified body region, initial encounter: Secondary | ICD-10-CM | POA: Diagnosis not present

## 2019-09-06 ENCOUNTER — Ambulatory Visit: Payer: Self-pay | Attending: Internal Medicine

## 2019-09-06 DIAGNOSIS — Z20822 Contact with and (suspected) exposure to covid-19: Secondary | ICD-10-CM

## 2019-09-07 LAB — SARS-COV-2, NAA 2 DAY TAT

## 2019-09-07 LAB — NOVEL CORONAVIRUS, NAA: SARS-CoV-2, NAA: DETECTED — AB

## 2019-09-08 ENCOUNTER — Telehealth: Payer: Self-pay | Admitting: Nurse Practitioner

## 2019-09-08 NOTE — Telephone Encounter (Signed)
Called to Discuss with patient about Covid symptoms and the use of bamlanivimab, a monoclonal antibody infusion for those with mild to moderate Covid symptoms and at a high risk of hospitalization.     Pt is qualified for this infusion at the [facility] infusion center due to co-morbid conditions and/or a member of an at-risk group.     Patient Active Problem List   Diagnosis Date Noted  . SVT/ PSVT/ PAT 08/08/2008    Patient declines infusion at this time. Symptoms tier reviewed as well as criteria for ending isolation. Preventative practices reviewed. Patient verbalized understanding.    Patient advised to go to Urgent care or ED with severe symptoms.

## 2021-02-17 ENCOUNTER — Other Ambulatory Visit: Payer: Self-pay

## 2021-02-17 ENCOUNTER — Encounter: Payer: Self-pay | Admitting: Emergency Medicine

## 2021-02-17 ENCOUNTER — Ambulatory Visit
Admission: EM | Admit: 2021-02-17 | Discharge: 2021-02-17 | Disposition: A | Discharge status: Home / Self Care | Payer: Managed Care, Other (non HMO) | Attending: Family Medicine | Admitting: Family Medicine

## 2021-02-17 DIAGNOSIS — S39012A Strain of muscle, fascia and tendon of lower back, initial encounter: Secondary | ICD-10-CM

## 2021-02-17 LAB — POCT URINALYSIS DIP (MANUAL ENTRY)
Blood, UA: NEGATIVE
Glucose, UA: NEGATIVE mg/dL
Ketones, POC UA: NEGATIVE mg/dL
Leukocytes, UA: NEGATIVE
Nitrite, UA: NEGATIVE
Protein Ur, POC: NEGATIVE mg/dL
Spec Grav, UA: >=1.03 — AB (ref 1.010–1.025)
Urobilinogen, UA: 0.2 E.U./dL
pH, UA: 6 (ref 5.0–8.0)

## 2021-02-17 MED ORDER — NAPROXEN 500 MG PO TABS
500.0000 mg | ORAL_TABLET | Freq: Two times a day (BID) | ORAL | 0 refills | Status: AC | PRN
Start: 2021-02-17 — End: ?

## 2021-02-17 MED ORDER — CYCLOBENZAPRINE HCL 10 MG PO TABS: 10.0000 mg | ORAL_TABLET | Freq: Three times a day (TID) | ORAL | 0 refills | Status: AC | PRN

## 2021-02-17 NOTE — ED Triage Notes (Signed)
Pt c/o right side lower back pain x 3 days. It hurts to lay down. Pt denies any urinary symptoms.

## 2021-02-17 NOTE — ED Provider Notes (Signed)
[facility]    CSN: [id number] Arrival date & time: 02/17/21  [id number]      History   Chief Complaint Chief Complaint  Patient presents with   Back Pain    HPI [name] [name] is a 65 y.o. male.   Presenting today with 3-day history of right flank pain posteriorly that is worse with bending, twisting, laying flat.  States the pain is deep and feels like a pulling sensation or muscle spasm.  He states he was moving furniture at work last week prior to onset of symptoms.  He denies abdominal pain, nausea, vomiting, diarrhea or constipation, fever, chills, dysuria, hematuria, chest pain, shortness of breath, bruising or other skin changes to the area.  Has been taking ibuprofen with mild temporary relief of symptoms.  States he had something similar to this several years ago and was treated for a muscle strain.   Past Medical History:  Diagnosis Date   Arrhythmia    SVT   SVT (supraventricular tachycardia) [facility]: [facility] Rhonda W)     Patient Active Problem List   Diagnosis Date Noted   SVT/ PSVT/ PAT 08/08/2008    Past Surgical History:  Procedure Laterality Date   KNEE SURGERY     SVT ABLATION N/A 05/13/2016   Procedure: SVT Ablation;  Surgeon: Evans Lance, MD;  Location: [place] [facility];  Service: Cardiovascular;  Laterality: N/A;       Home Medications    Prior to Admission medications   Medication Sig Start Date End Date Taking? Authorizing Provider  cyclobenzaprine (FLEXERIL) 10 MG tablet Take 1 tablet (10 mg total) by mouth 3 (three) times daily as needed for muscle spasms. Do not drink alcohol or drive while taking this medication.  May cause drowsiness. 02/17/21  Yes Volney American, PA-C  ibuprofen (ADVIL,MOTRIN) 200 MG tablet Take 200 mg by mouth daily as needed for headache or moderate pain.   Yes [provider]  naproxen (NAPROSYN) 500 MG tablet Take 1 tablet (500 mg total) by mouth 2 (two) times daily as needed. 02/17/21  Yes Volney American, PA-C  aspirin EC 81 MG tablet Take 81 mg by mouth daily.    [provider]    Family History Family History  Problem Relation Age of Onset   Cancer - Ovarian Mother     Social History Social History   Tobacco Use   Smoking status: Never   Smokeless tobacco: Never  Vaping Use   Vaping Use: Never used  Substance Use Topics   Alcohol use: Yes   Drug use: No     Allergies   Patient has no known allergies.   Review of Systems Review of Systems Per HPI  Physical Exam Triage Vital Signs ED Triage Vitals  Enc Vitals Group     BP 02/17/21 0838 (!) 169/100     Pulse Rate 02/17/21 0838 67     Resp 02/17/21 0838 16     Temp 02/17/21 0838 97.7 F (36.5 C)     Temp Source 02/17/21 0838 Oral     SpO2 02/17/21 0838 97 %     Weight --      Height --      Head Circumference --      Peak Flow --      Pain Score 02/17/21 0836 7     Pain Loc --      Pain Edu? --      Excl. in [place]? --  No data found.  Updated Vital Signs BP (!) 169/100 (BP Location: Right Arm)    Pulse 67    Temp 97.7 F (36.5 C) (Oral)    Resp 16    SpO2 97%   Visual Acuity Right Eye Distance:   Left Eye Distance:   Bilateral Distance:    Right Eye Near:   Left Eye Near:    Bilateral Near:     Physical Exam Vitals and nursing note reviewed.  Constitutional:      Appearance: Normal appearance.  HENT:     Head: Atraumatic.     Mouth/Throat:     Mouth: Mucous membranes are moist.  Eyes:     Extraocular Movements: Extraocular movements intact.     Conjunctiva/sclera: Conjunctivae normal.  Cardiovascular:     Rate and Rhythm: Normal rate and regular rhythm.     Pulses: Normal pulses.  Pulmonary:     Effort: Pulmonary effort is normal.     Breath sounds: Normal breath sounds. No wheezing or rales.  Abdominal:     General: Bowel sounds are normal. There is no distension.     Palpations: Abdomen is soft.     Tenderness: There is no abdominal tenderness. There is no  guarding.  Musculoskeletal:        General: Tenderness present. No swelling, deformity or signs of injury. Normal range of motion.     Cervical back: Normal range of motion and neck supple.     Comments: No midline spinal tenderness to palpation diffusely.  Tenderness on deep palpation to right lateral mid back  Skin:    General: Skin is warm and dry.     Findings: No bruising or erythema.  Neurological:     General: No focal deficit present.     Mental Status: He is oriented to person, place, and time.     Motor: No weakness.     Gait: Gait normal.  Psychiatric:        Mood and Affect: Mood normal.        Thought Content: Thought content normal.        Judgment: Judgment normal.     UC Treatments / Results  Labs (all labs ordered are listed, but only abnormal results are displayed) Labs Reviewed  POCT URINALYSIS DIP (MANUAL ENTRY) - Abnormal; Notable for the following components:      Result Value   Bilirubin, UA small (*)    Spec Grav, UA >=1.030 (*)    All other components within normal limits    EKG   Radiology No results found.  Procedures Procedures (including critical care time)  Medications Ordered in UC Medications - No data to display  Initial Impression / Assessment and Plan / UC Course  I have reviewed the triage vital signs and the nursing notes.  Pertinent labs & imaging results that were available during my care of the patient were reviewed by me and considered in my medical decision making (see chart for details).     Hypertensive in triage, otherwise vital signs reassuring.  UA without evidence of blood or bacteria low suspicion for kidney stone, urinary tract infection.  Pain reproducible on exam with deep palpation and movement, suspect muscular strain causing symptoms.  We will treat with Flexeril, naproxen, heat, rest.  Return for acutely worsening symptoms.  Final Clinical Impressions(s) / UC Diagnoses   Final diagnoses:  Back strain,  initial encounter   Discharge Instructions   None    ED Prescriptions  Medication Sig Dispense Auth. Provider   cyclobenzaprine (FLEXERIL) 10 MG tablet Take 1 tablet (10 mg total) by mouth 3 (three) times daily as needed for muscle spasms. Do not drink alcohol or drive while taking this medication.  May cause drowsiness. 15 tablet Particia Nearing, [place]   naproxen (NAPROSYN) 500 MG tablet Take 1 tablet (500 mg total) by mouth 2 (two) times daily as needed. 30 tablet Particia Nearing, [place]      PDMP not reviewed this encounter.   Roosvelt Maser [place], [place] 02/17/21 [phone]

## 2024-03-03 ENCOUNTER — Ambulatory Visit: Admitting: Urology

## 2024-05-12 ENCOUNTER — Ambulatory Visit: Admitting: Urology
